# Patient Record
Sex: Male | Born: 1970 | Race: Black or African American | Hispanic: No | Marital: Married | Smoking: Never smoker
Health system: Southern US, Community
[De-identification: ages and names within clinical notes are randomized; demographics above are authoritative.]

## PROBLEM LIST (undated history)

## (undated) DIAGNOSIS — Z8781 Personal history of (healed) traumatic fracture: Secondary | ICD-10-CM

## (undated) HISTORY — PX: ANKLE SURGERY: SHX546

## (undated) HISTORY — DX: Personal history of (healed) traumatic fracture: Z87.81

---

## 2009-12-29 ENCOUNTER — Ambulatory Visit: Payer: Self-pay | Admitting: Radiology

## 2009-12-29 ENCOUNTER — Emergency Department (HOSPITAL_BASED_OUTPATIENT_CLINIC_OR_DEPARTMENT_OTHER): Admission: EM | Admit: 2009-12-29 | Discharge: 2009-12-29 | Payer: Self-pay | Admitting: Emergency Medicine

## 2010-07-24 ENCOUNTER — Emergency Department (HOSPITAL_BASED_OUTPATIENT_CLINIC_OR_DEPARTMENT_OTHER): Admission: EM | Admit: 2010-07-24 | Discharge: 2010-07-24 | Payer: Self-pay | Admitting: Emergency Medicine

## 2013-05-05 ENCOUNTER — Emergency Department (HOSPITAL_BASED_OUTPATIENT_CLINIC_OR_DEPARTMENT_OTHER)
Admission: EM | Admit: 2013-05-05 | Discharge: 2013-05-05 | Disposition: A | Discharge status: Home / Self Care | Payer: No Typology Code available for payment source | Attending: Emergency Medicine | Admitting: Emergency Medicine

## 2013-05-05 ENCOUNTER — Encounter (HOSPITAL_BASED_OUTPATIENT_CLINIC_OR_DEPARTMENT_OTHER): Payer: Self-pay

## 2013-05-05 DIAGNOSIS — Y92410 Unspecified street and highway as the place of occurrence of the external cause: Secondary | ICD-10-CM | POA: Insufficient documentation

## 2013-05-05 DIAGNOSIS — Y9389 Activity, other specified: Secondary | ICD-10-CM | POA: Insufficient documentation

## 2013-05-05 DIAGNOSIS — R404 Transient alteration of awareness: Secondary | ICD-10-CM | POA: Insufficient documentation

## 2013-05-05 DIAGNOSIS — S39012A Strain of muscle, fascia and tendon of lower back, initial encounter: Secondary | ICD-10-CM

## 2013-05-05 DIAGNOSIS — S335XXA Sprain of ligaments of lumbar spine, initial encounter: Secondary | ICD-10-CM | POA: Insufficient documentation

## 2013-05-05 MED ORDER — METHOCARBAMOL 500 MG PO TABS: 500.0000 mg | ORAL_TABLET | Freq: Two times a day (BID) | ORAL | Status: DC

## 2013-05-05 MED ORDER — IBUPROFEN 800 MG PO TABS: 800.0000 mg | ORAL_TABLET | Freq: Three times a day (TID) | ORAL | Status: DC
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
## 2013-05-05 NOTE — ED Provider Notes (Signed)
Medical screening examination/treatment/procedure(s) were performed by non-physician practitioner and as supervising physician I was immediately available for consultation/collaboration.  Layla Maw Steffon Gladu, DO 05/05/13 2315
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
## 2015-07-25 ENCOUNTER — Encounter (HOSPITAL_BASED_OUTPATIENT_CLINIC_OR_DEPARTMENT_OTHER): Payer: Self-pay | Admitting: *Deleted

## 2015-07-25 ENCOUNTER — Emergency Department (HOSPITAL_BASED_OUTPATIENT_CLINIC_OR_DEPARTMENT_OTHER)
Admission: EM | Admit: 2015-07-25 | Discharge: 2015-07-25 | Disposition: A | Discharge status: Home / Self Care | Payer: Self-pay | Attending: Emergency Medicine | Admitting: Emergency Medicine

## 2015-07-25 DIAGNOSIS — J069 Acute upper respiratory infection, unspecified: Secondary | ICD-10-CM

## 2015-07-25 DIAGNOSIS — Z87828 Personal history of other (healed) physical injury and trauma: Secondary | ICD-10-CM | POA: Insufficient documentation

## 2015-07-25 DIAGNOSIS — Z79899 Other long term (current) drug therapy: Secondary | ICD-10-CM | POA: Insufficient documentation

## 2015-07-25 MED ORDER — FLUTICASONE PROPIONATE 50 MCG/ACT NA SUSP: 2.0000 | Freq: Every day | NASAL | Status: DC

## 2015-07-25 MED ORDER — FEXOFENADINE-PSEUDOEPHED ER 60-120 MG PO TB12
1.0000 | ORAL_TABLET | Freq: Two times a day (BID) | ORAL | Status: DC
Start: 2015-07-25 — End: 2017-03-13

## 2015-07-25 NOTE — ED Notes (Signed)
States has had a cough with congestion for the past week. States has prod cough of thick green sputum

## 2015-07-25 NOTE — ED Notes (Signed)
MD at bedside. 

## 2015-07-25 NOTE — ED Provider Notes (Signed)
CSN: [id number]     Arrival date & time 07/25/15  [id number] History   First MD Initiated Contact with Patient 07/25/15 [phone]     Chief Complaint  Patient presents with  . Nasal Congestion     (Consider location/radiation/quality/duration/timing/severity/associated sxs/prior Treatment) Patient is a 44 y.o. male presenting with URI. The history is provided by the patient.  URI Presenting symptoms: congestion and cough   Presenting symptoms: no fever and no sore throat   Severity:  Moderate Onset quality:  Gradual Duration:  1 week Timing:  Constant Progression:  Worsening Chronicity:  New Relieved by:  Nothing Worsened by:  Nothing tried Ineffective treatments:  None tried Associated symptoms: headaches   Risk factors: no chronic respiratory disease, no diabetes mellitus and no recent illness     History reviewed. No pertinent past medical history. Past Surgical History  Procedure Laterality Date  . Ankle surgery     History reviewed. No pertinent family history. Social History  Substance Use Topics  . Smoking status: Never Smoker   . Smokeless tobacco: None  . Alcohol Use: No    Review of Systems  Constitutional: Negative for fever.  HENT: Positive for congestion. Negative for sore throat.   Respiratory: Positive for cough.   Neurological: Positive for headaches.  All other systems reviewed and are negative.     Allergies  Review of patient's allergies indicates no known allergies.  Home Medications   Prior to Admission medications   Medication Sig Start Date End Date Taking? Authorizing Provider  ibuprofen (ADVIL,MOTRIN) 800 MG tablet Take 1 tablet (800 mg total) by mouth 3 (three) times daily. 05/05/13   Elson AreasLeslie K Sofia, PA-C  methocarbamol (ROBAXIN) 500 MG tablet Take 1 tablet (500 mg total) by mouth 2 (two) times daily. 05/05/13   Elson AreasLeslie K Sofia, PA-C   BP 123/84 mmHg  Pulse 60  Temp(Src) 98.1 F (36.7 C) (Oral)  Resp 18  Ht 5\' 9"  (1.753 m)  Wt 190 lb  (86.183 kg)  BMI 28.05 kg/m2  SpO2 98% Physical Exam  Constitutional: He is oriented to person, place, and time. He appears well-developed and well-nourished. No distress.  HENT:  Head: Normocephalic and atraumatic.  Nose: Right sinus exhibits no maxillary sinus tenderness and no frontal sinus tenderness. Left sinus exhibits no maxillary sinus tenderness and no frontal sinus tenderness.  Eyes: Conjunctivae are normal.  Neck: Neck supple. No tracheal deviation present.  Cardiovascular: Normal rate and regular rhythm.   Pulmonary/Chest: Effort normal. No respiratory distress.  Abdominal: Soft. He exhibits no distension.  Neurological: He is alert and oriented to person, place, and time.  Skin: Skin is warm and dry.  Psychiatric: He has a normal mood and affect.    ED Course  Procedures (including critical care time) Labs Review Labs Reviewed - No data to display  Imaging Review No results found. I have personally reviewed and evaluated these images and lab results as part of my medical decision-making.   EKG Interpretation None      MDM   Final diagnoses:  Upper respiratory infection    44 year old male presents with 1 week of typical upper respiratory infection symptoms which include nasal congestion, cough, sputum production. He is afebrile and otherwise well-appearing. He has no difficulty breathing currently. No indication for imaging. Possibility of postnasal drip secondary to allergic rhinitis. We'll treat empirically with decongestant, antihistamine, nasal spray. Patient needs to establish primary care in the area and was provided contact information to do so.  Lyndal Pulley, MD 07/25/15 [phone]

## 2015-07-25 NOTE — Discharge Instructions (Signed)
Upper Respiratory Infection, Adult Most upper respiratory infections (URIs) are a viral infection of the air passages leading to the lungs. A URI affects the nose, throat, and upper air passages. The most common type of URI is nasopharyngitis and is typically referred to as "the common cold." URIs run their course and usually go away on their own. Most of the time, a URI does not require medical attention, but sometimes a bacterial infection in the upper airways can follow a viral infection. This is called a secondary infection. Sinus and middle ear infections are common types of secondary upper respiratory infections. Bacterial pneumonia can also complicate a URI. A URI can worsen asthma and chronic obstructive pulmonary disease (COPD). Sometimes, these complications can require emergency medical care and may be life threatening.  CAUSES Almost all URIs are caused by viruses. A virus is a type of germ and can spread from one person to another.  RISKS FACTORS You may be at risk for a URI if:   You smoke.   You have chronic heart or lung disease.  You have a weakened defense (immune) system.   You are very young or very old.   You have nasal allergies or asthma.  You work in crowded or poorly ventilated areas.  You work in health care facilities or schools. SIGNS AND SYMPTOMS  Symptoms typically develop 2-3 days after you come in contact with a cold virus. Most viral URIs last 7-10 days. However, viral URIs from the influenza virus (flu virus) can last 14-18 days and are typically more severe. Symptoms may include:   Runny or stuffy (congested) nose.   Sneezing.   Cough.   Sore throat.   Headache.   Fatigue.   Fever.   Loss of appetite.   Pain in your forehead, behind your eyes, and over your cheekbones (sinus pain).  Muscle aches.  DIAGNOSIS  Your health care provider may diagnose a URI by:  Physical exam.  Tests to check that your symptoms are not due to  another condition such as:  Strep throat.  Sinusitis.  Pneumonia.  Asthma. TREATMENT  A URI goes away on its own with time. It cannot be cured with medicines, but medicines may be prescribed or recommended to relieve symptoms. Medicines may help:  Reduce your fever.  Reduce your cough.  Relieve nasal congestion. HOME CARE INSTRUCTIONS   Take medicines only as directed by your health care provider.   Gargle warm saltwater or take cough drops to comfort your throat as directed by your health care provider.  Use a warm mist humidifier or inhale steam from a shower to increase air moisture. This may make it easier to breathe.  Drink enough fluid to keep your urine clear or pale yellow.   Eat soups and other clear broths and maintain good nutrition.   Rest as needed.   Return to work when your temperature has returned to normal or as your health care provider advises. You may need to stay home longer to avoid infecting others. You can also use a face mask and careful hand washing to prevent spread of the virus.  Increase the usage of your inhaler if you have asthma.   Do not use any tobacco products, including cigarettes, chewing tobacco, or electronic cigarettes. If you need help quitting, ask your health care provider. PREVENTION  The best way to protect yourself from getting a cold is to practice good hygiene.   Avoid oral or hand contact with people with cold   symptoms.   Wash your hands often if contact occurs.  There is no clear evidence that vitamin C, vitamin E, echinacea, or exercise reduces the chance of developing a cold. However, it is always recommended to get plenty of rest, exercise, and practice good nutrition.  SEEK MEDICAL CARE IF:   You are getting worse rather than better.   Your symptoms are not controlled by medicine.   You have chills.  You have worsening shortness of breath.  You have brown or red mucus.  You have yellow or brown nasal  discharge.  You have pain in your face, especially when you bend forward.  You have a fever.  You have swollen neck glands.  You have pain while swallowing.  You have white areas in the back of your throat. SEEK IMMEDIATE MEDICAL CARE IF:   You have severe or persistent:  Headache.  Ear pain.  Sinus pain.  Chest pain.  You have chronic lung disease and any of the following:  Wheezing.  Prolonged cough.  Coughing up blood.  A change in your usual mucus.  You have a stiff neck.  You have changes in your:  Vision.  Hearing.  Thinking.  Mood. MAKE SURE YOU:   Understand these instructions.  Will watch your condition.  Will get help right away if you are not doing well or get worse.   This information is not intended to replace advice given to you by your health care provider. Make sure you discuss any questions you have with your health care provider.   Document Released: 02/15/2001 Document Revised: 01/06/2015 Document Reviewed: 11/27/2013 Elsevier Interactive Patient Education 2016 [employer]  

## 2017-03-13 ENCOUNTER — Encounter (HOSPITAL_BASED_OUTPATIENT_CLINIC_OR_DEPARTMENT_OTHER): Payer: Self-pay | Admitting: *Deleted

## 2017-03-13 ENCOUNTER — Emergency Department (HOSPITAL_BASED_OUTPATIENT_CLINIC_OR_DEPARTMENT_OTHER)
Admission: EM | Admit: 2017-03-13 | Discharge: 2017-03-14 | Disposition: A | Discharge status: Home / Self Care | Payer: Self-pay | Attending: Emergency Medicine | Admitting: Emergency Medicine

## 2017-03-13 DIAGNOSIS — R1031 Right lower quadrant pain: Secondary | ICD-10-CM | POA: Insufficient documentation

## 2017-03-13 DIAGNOSIS — R109 Unspecified abdominal pain: Secondary | ICD-10-CM

## 2017-03-13 DIAGNOSIS — J069 Acute upper respiratory infection, unspecified: Secondary | ICD-10-CM

## 2017-03-13 DIAGNOSIS — Z79899 Other long term (current) drug therapy: Secondary | ICD-10-CM | POA: Insufficient documentation

## 2017-03-13 LAB — CBC
HCT: 43.4 % (ref 39.0–52.0)
Hemoglobin: 14.5 g/dL (ref 13.0–17.0)
MCH: 30.9 pg (ref 26.0–34.0)
MCHC: 33.4 g/dL (ref 30.0–36.0)
MCV: 92.3 fL (ref 78.0–100.0)
PLATELETS: 252 10*3/uL (ref 150–400)
RBC: 4.7 MIL/uL (ref 4.22–5.81)
RDW: 12.5 % (ref 11.5–15.5)
WBC: 10 10*3/uL (ref 4.0–10.5)

## 2017-03-13 LAB — URINALYSIS, ROUTINE W REFLEX MICROSCOPIC
BILIRUBIN URINE: NEGATIVE
Glucose, UA: NEGATIVE mg/dL
HGB URINE DIPSTICK: NEGATIVE
KETONES UR: NEGATIVE mg/dL
NITRITE: NEGATIVE
PH: 6 (ref 5.0–8.0)
Protein, ur: NEGATIVE mg/dL
SPECIFIC GRAVITY, URINE: 1.026 (ref 1.005–1.030)

## 2017-03-13 LAB — COMPREHENSIVE METABOLIC PANEL
ALK PHOS: 75 U/L (ref 38–126)
ALT: 49 U/L (ref 17–63)
AST: 29 U/L (ref 15–41)
Albumin: 4.1 g/dL (ref 3.5–5.0)
Anion gap: 7 (ref 5–15)
BUN: 10 mg/dL (ref 6–20)
CALCIUM: 8.9 mg/dL (ref 8.9–10.3)
CHLORIDE: 104 mmol/L (ref 101–111)
CO2: 26 mmol/L (ref 22–32)
CREATININE: 1.26 mg/dL — AB (ref 0.61–1.24)
GFR calc Af Amer: >60 mL/min (ref >60)
GFR calc non Af Amer: >60 mL/min (ref >60)
Glucose, Bld: 112 mg/dL — ABNORMAL HIGH (ref 65–99)
Potassium: 3.6 mmol/L (ref 3.5–5.1)
SODIUM: 137 mmol/L (ref 135–145)
Total Bilirubin: 0.5 mg/dL (ref 0.3–1.2)
Total Protein: 7.5 g/dL (ref 6.5–8.1)

## 2017-03-13 LAB — URINALYSIS, MICROSCOPIC (REFLEX): RBC / HPF: NONE SEEN RBC/hpf (ref 0–5)

## 2017-03-13 LAB — LIPASE, BLOOD: LIPASE: 37 U/L (ref 11–51)

## 2017-03-13 MED ORDER — ONDANSETRON HCL 4 MG/2ML IJ SOLN
4.0000 mg | Freq: Once | INTRAMUSCULAR | Status: AC
  Administered 2017-03-13: 4 mg via INTRAVENOUS
  Filled 2017-03-13: qty 2

## 2017-03-13 MED ORDER — SODIUM CHLORIDE 0.9 % IV BOLUS (SEPSIS)
1000.0000 mL | Freq: Once | INTRAVENOUS | Status: AC
  Administered 2017-03-13: 1000 mL via INTRAVENOUS

## 2017-03-13 MED ORDER — MORPHINE SULFATE (PF) 4 MG/ML IV SOLN
4.0000 mg | Freq: Once | INTRAVENOUS | Status: AC
  Administered 2017-03-13: 4 mg via INTRAVENOUS
  Filled 2017-03-13: qty 1

## 2017-03-13 NOTE — ED Provider Notes (Signed)
TIME SEEN: 11:40 PM  By signing my name below, I, Cynda Acres, attest that this documentation has been prepared under the direction and in the presence of Renetta Suman, Layla Maw, DO. Electronically Signed: Cynda Acres, Scribe. 03/13/17. 11:50 PM.  CHIEF COMPLAINT: Right flank pain   HPI:  Christian Tate is a 46 y.o. male with no pertinent past medical history,  who presents to the Emergency Department complaining of sudden-onset, constant right flank pain that began two hours prior to arrival. Patient reports developing gradually worsening right flank pain earlier tonight. Patient reports going to gym recently. P he doesn't recall doing anything increasingly severe as her injuring his back. He does report the pain is worse with movement and palpation. No history of kidney stones, kidney infections. No bowel or bladder incontinence. No numbness, tingling or focal weakness. He is able to ambulate. No midline back pain. No dysuria or hematuria. Denies nausea, vomiting or diarrhea.   Patient is also had a cough with green sputum production for the past several days and subjective fever. He denies any chest pain or shortness of breath. Has also had nasal congestion.     ROS: See HPI Constitutional: Subjective fever  Eyes: no drainage  ENT:  runny nose   Cardiovascular:  no chest pain  Resp: no SOB  GI: no vomiting GU: no dysuria Integumentary: no rash  Allergy: no hives  Musculoskeletal: no leg swelling  Neurological: no slurred speech ROS otherwise negative  PAST MEDICAL HISTORY/PAST SURGICAL HISTORY:  History reviewed. No pertinent past medical history.  MEDICATIONS:  Prior to Admission medications   Medication Sig Start Date End Date Taking? Authorizing Provider  fluticasone (FLONASE) 50 MCG/ACT nasal spray Place 2 sprays into both nostrils daily. 07/25/15   Lyndal Pulley, MD  ibuprofen (ADVIL,MOTRIN) 800 MG tablet Take 1 tablet (800 mg total) by mouth 3 (three) times daily. 05/05/13    Elson Areas, PA-C    ALLERGIES:  No Known Allergies  SOCIAL HISTORY:  Social History  Substance Use Topics  . Smoking status: Never Smoker  . Smokeless tobacco: Not on file  . Alcohol use No    FAMILY HISTORY: History reviewed. No pertinent family history.  EXAM: BP 137/89 (BP Location: Left Arm)   Pulse (!) 52   Temp 98.9 F (37.2 C) (Oral)   Resp 16   Ht 5\' 9"  (1.753 m)   Wt 190 lb (86.2 kg)   SpO2 100%   BMI 28.06 kg/m  CONSTITUTIONAL: Alert and oriented and responds appropriately to questions. Well-appearing; well-nourished HEAD: Normocephalic EYES: Conjunctivae clear, pupils appear equal, EOMI ENT: normal nose; moist mucous membranes; No pharyngeal erythema or petechiae, no tonsillar hypertrophy or exudate, no uvular deviation, no unilateral swelling, no trismus or drooling, no muffled voice, normal phonation, no stridor, no dental caries present, no drainable dental abscess noted, no Ludwig's angina, tongue sits flat in the bottom of the mouth, no angioedema, no facial erythema or warmth, no facial swelling; no pain with movement of the neck. NECK: Supple, no meningismus, no nuchal rigidity, no LAD  CARD: RRR; S1 and S2 appreciated; no murmurs, no clicks, no rubs, no gallops RESP: Normal chest excursion without splinting or tachypnea; breath sounds clear and equal bilaterally; no wheezes, no rhonchi, no rales, no hypoxia or respiratory distress, speaking full sentences ABD/GI: Normal bowel sounds; non-distended; soft, non-tender, no rebound, no guarding, no peritoneal signs, no hepatosplenomegaly BACK:  The back appears normal and is mildly tender over the right flank, no midline  spinal tenderness or step-off or deformity, there is no CVA tenderness EXT: Normal ROM in all joints; non-tender to palpation; no edema; normal capillary refill; no cyanosis, no calf tenderness or swelling    SKIN: Normal color for age and race; warm; no rash NEURO: Moves all extremities  equally, no saddle anesthesia, sensation to light touch intact diffusely, normal speech, normal gait PSYCH: The patient's mood and manner are appropriate. Grooming and personal hygiene are appropriate.  MEDICAL DECISION MAKING: Patient here to [facility] complaints. Has had subjective fevers, cough of the past several days. He does feel warm to touch air but is afebrile. We'll obtain chest x-ray for further evaluation. Also complaining of right flank pain. Labs obtained in triage are unremarkable other than creatinine of 1.26. Urine shows small leukocytes but rare bacteria. He is not having urinary symptoms. No blood. LFTs, lipase, white blood cell count normal. We'll obtain CT of his abdomen for further evaluation. Differential includes kidney stone, pyelonephritis, colitis, appendicitis.  ED PROGRESS: Patient's CT scan unremarkable. No nephrolithiasis, hydronephrosis, perinephric stranding. Appendix is normal. Bowel appears normal. Suspect this is musculoskeletal in nature. Pain improved with morphine. His chest x-ray is also clear. I do not feel he needs antibiotics at this time. We'll discharge with course of Vicodin for pain control. Recommended close outpatient follow-up if symptoms continue are not improving. He does not have any focal neurologic deficits to suggest cauda equina, epidural abscess or hematoma, discitis, transverse myelitis.  No midline tenderness on exam. Neurologically intact.  At this time, I do not feel there is any life-threatening condition present. I have reviewed and discussed all results (EKG, imaging, lab, urine as appropriate) and exam findings with patient/family. I have reviewed nursing notes and appropriate previous records.  I feel the patient is safe to be discharged home without further emergent workup and can continue workup as an outpatient as needed. Discussed usual and customary return precautions. Patient/family verbalize understanding and are comfortable with this plan.   Outpatient follow-up has been provided if needed. All questions have been answered.   I personally performed the services described in this documentation, which was scribed in my presence. The recorded information has been reviewed and is accurate.     Clyde Zarrella, Layla MawKristen N, DO 03/14/17 [phone]

## 2017-03-13 NOTE — ED Triage Notes (Signed)
Pt c/o left flank pain x 2 hrs 

## 2017-03-14 ENCOUNTER — Emergency Department (HOSPITAL_BASED_OUTPATIENT_CLINIC_OR_DEPARTMENT_OTHER): Payer: Self-pay

## 2017-03-14 MED ORDER — GUAIFENESIN 100 MG/5ML PO LIQD: 100.0000 mg | ORAL | 0 refills | Status: DC | PRN

## 2017-03-14 MED ORDER — HYDROCODONE-ACETAMINOPHEN 5-325 MG PO TABS: 1.0000 | ORAL_TABLET | Freq: Four times a day (QID) | ORAL | 0 refills | Status: DC | PRN

## 2017-03-14 NOTE — ED Notes (Signed)
Patient transported to CT 

## 2017-03-14 NOTE — ED Notes (Signed)
ED Provider at bedside discussing test results and dispo plan of care. 

## 2017-07-20 ENCOUNTER — Ambulatory Visit: Payer: Self-pay | Admitting: Family Medicine

## 2017-08-09 ENCOUNTER — Ambulatory Visit: Payer: Self-pay | Admitting: Nurse Practitioner

## 2018-12-25 IMAGING — CT CT RENAL STONE PROTOCOL
2 of 4 series · 16 of 46 positions shown, 18 images · non-contrast
Comparison: None.

CLINICAL DATA: Initial evaluation for acute right flank pain.

EXAM:
CT ABDOMEN AND PELVIS WITHOUT CONTRAST
TECHNIQUE: Multidetector CT imaging of the abdomen and pelvis was performed
following the standard protocol without IV contrast.

[Series 3: axial st · axial · 0.80mm/px · z∈[-190,+260]mm · 13 of 100 slices shown, 15 images]
[im 5/100  soft-tissue]
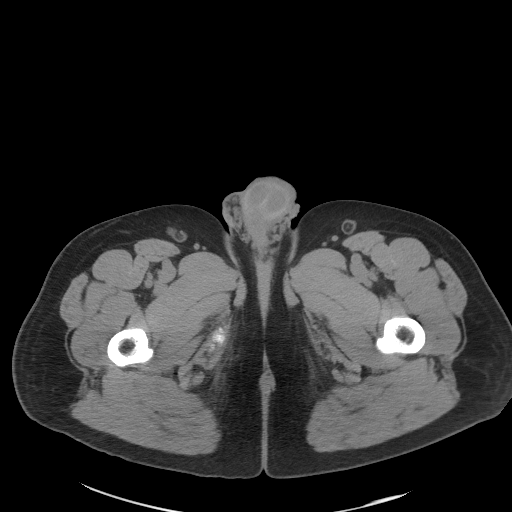
[im 5/100  bone]
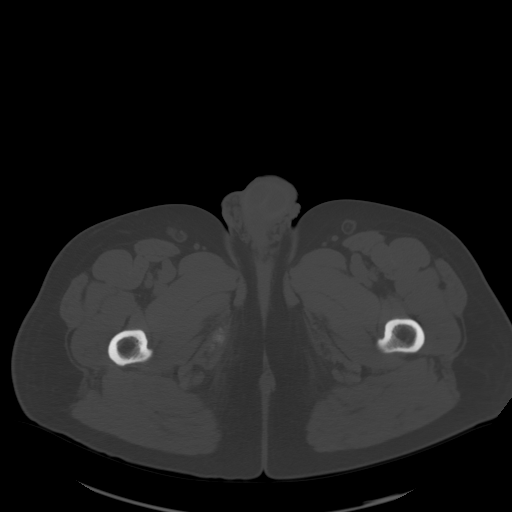
[im 13/100  soft-tissue]
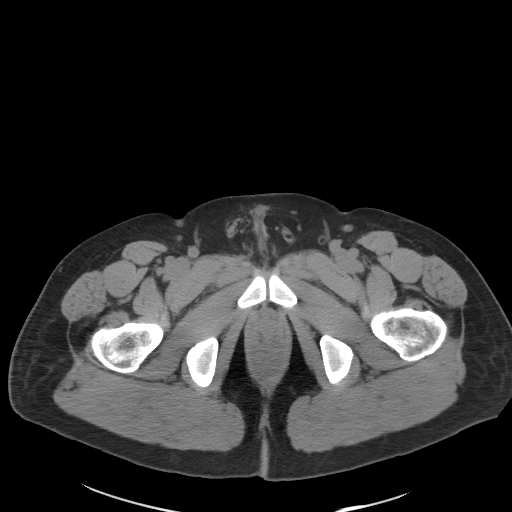
[im 22/100  soft-tissue]
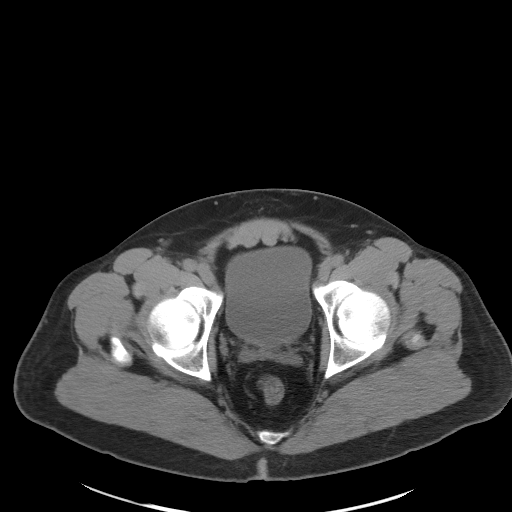
[im 26/100  soft-tissue]
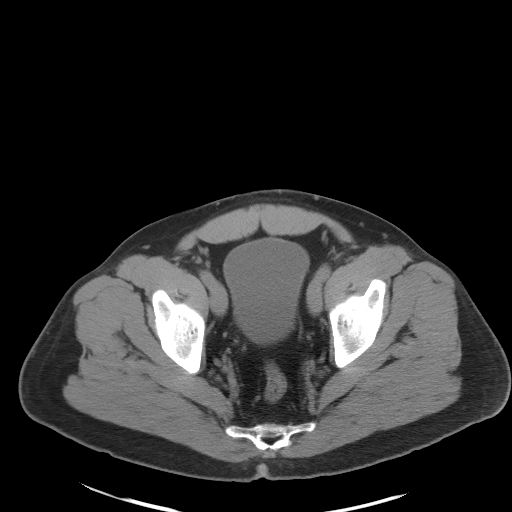
[im 35/100  soft-tissue]
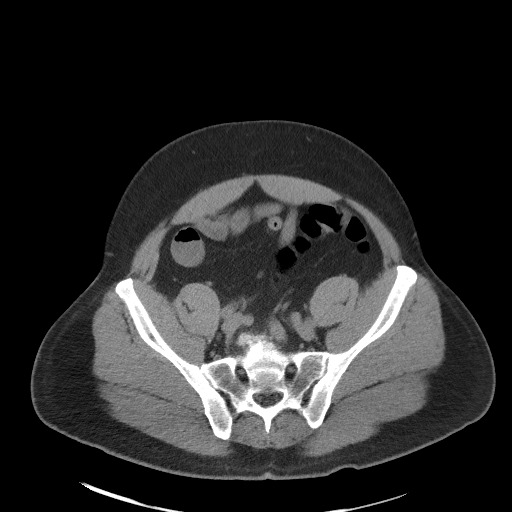
[im 44/100  soft-tissue]
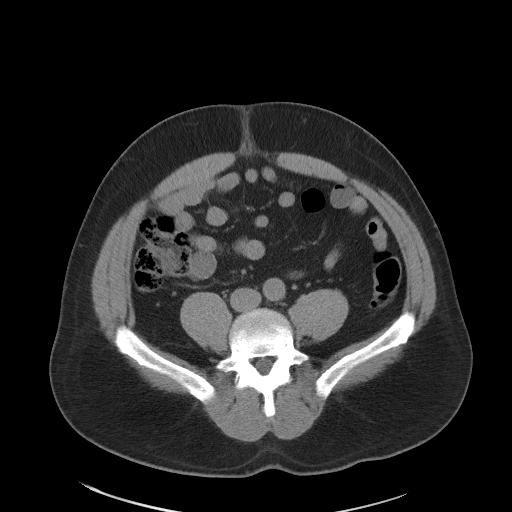
[im 52/100  soft-tissue]
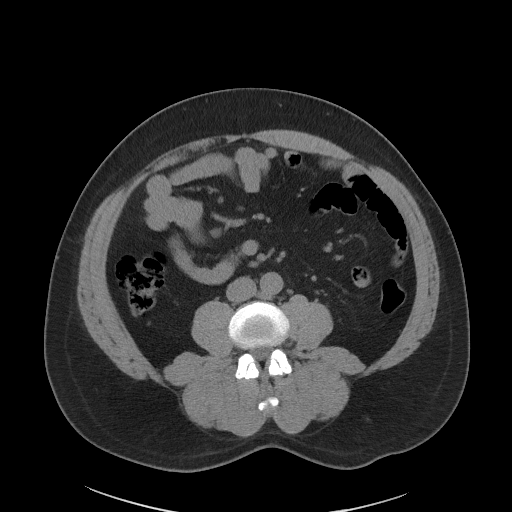
[im 56/100  soft-tissue]
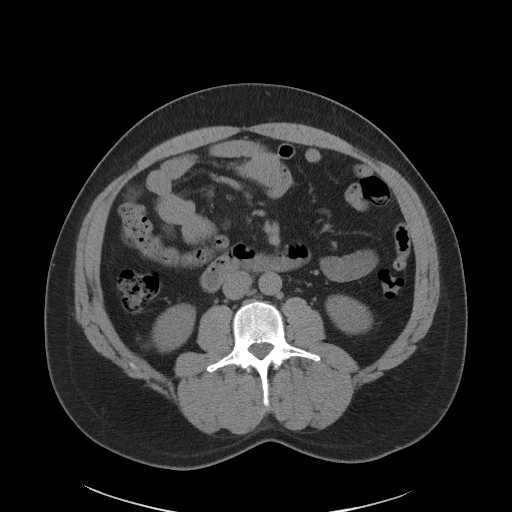
[im 65/100  soft-tissue]
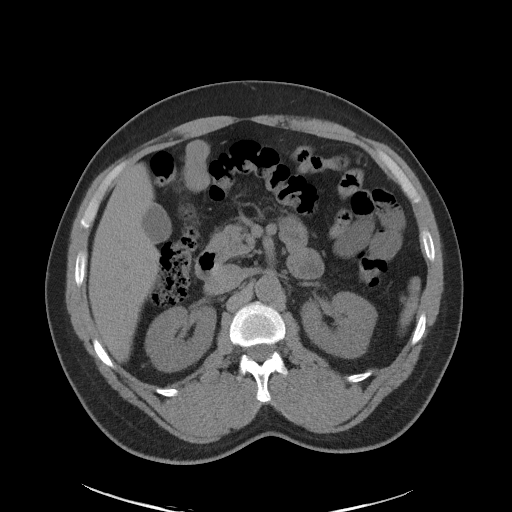
[im 65/100  bone]
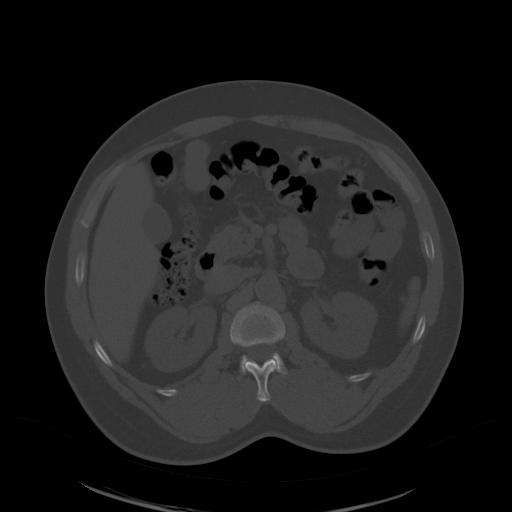
[im 74/100  soft-tissue]
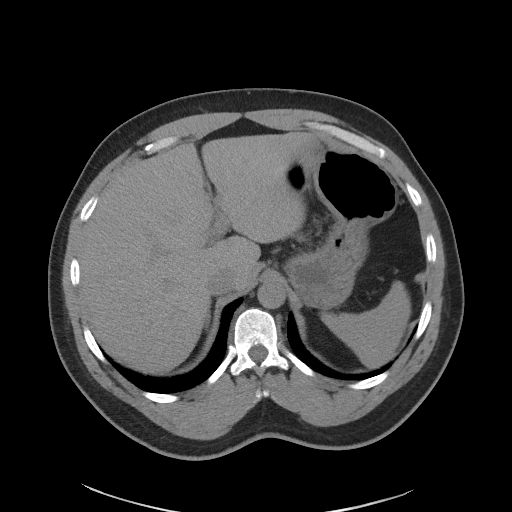
[im 78/100  soft-tissue]
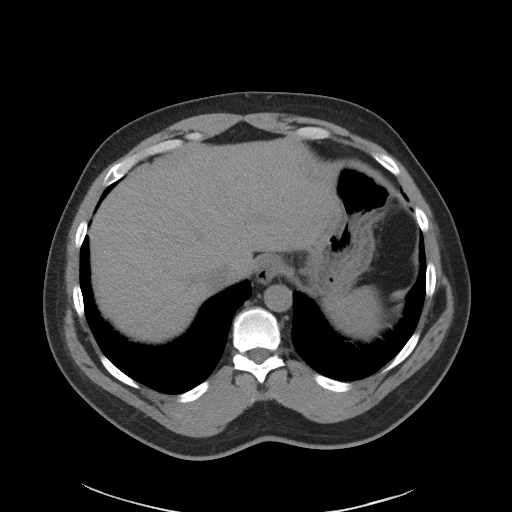
[im 87/100  soft-tissue]
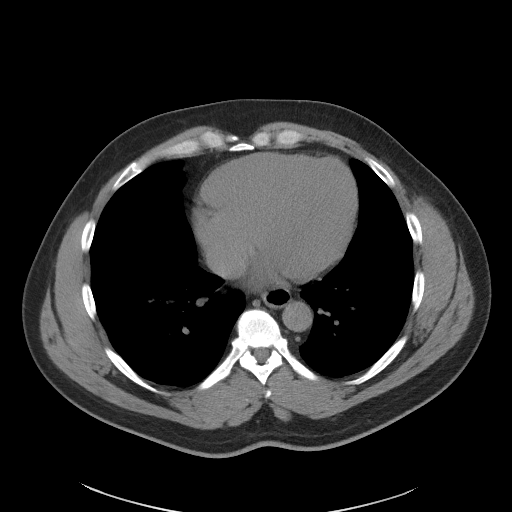
[im 95/100  soft-tissue]
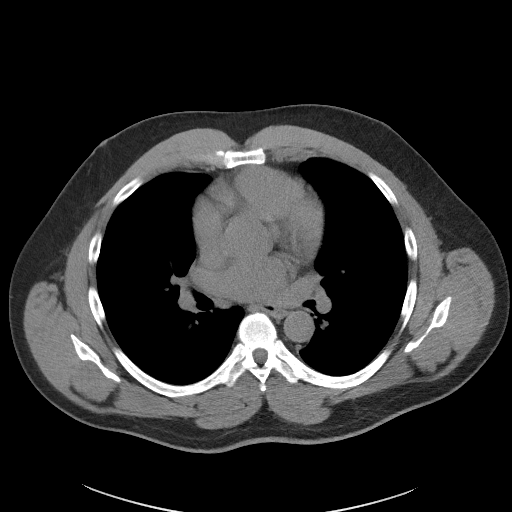

[Series 5: coronal st · coronal · 0.81mm/px · 3 of 94 slices shown]
[im 32/94  soft-tissue]
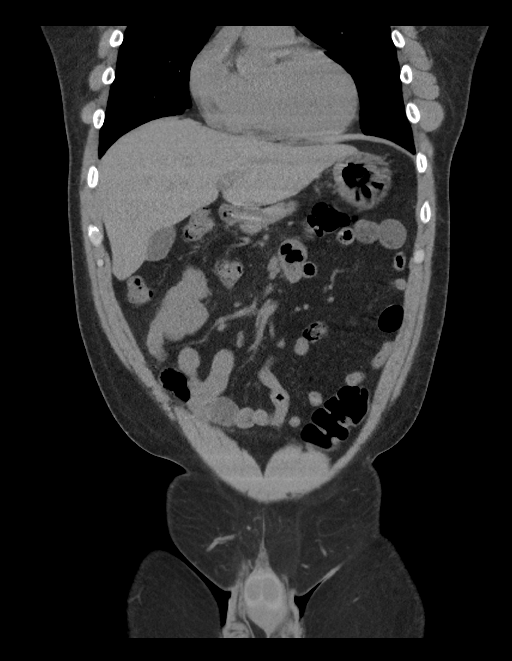
[im 42/94  soft-tissue]
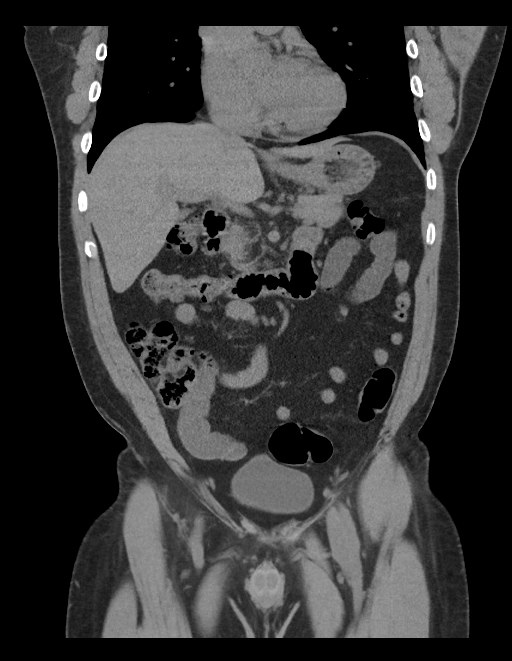
[im 52/94  soft-tissue]
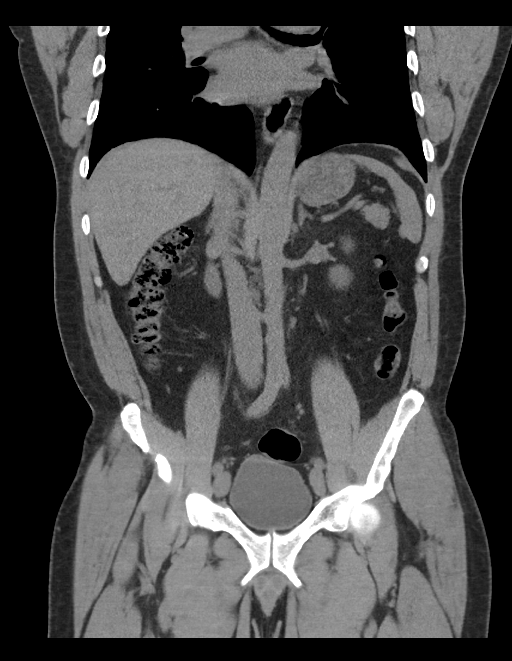

[16 of 46 positions shown; findings below may reference images not displayed]

FINDINGS: Lower chest: Mild hazy subsegmental atelectatic changes present
within the visualized lung bases. Visualized lung bases are
otherwise clear.

Hepatobiliary: Limited noncontrast evaluation liver is unremarkable.
Gallbladder within normal limits. No biliary dilatation.

Pancreas: Pancreas within normal limits.

Spleen: Spleen within normal limits.

Adrenals/Urinary Tract: Adrenal glands are normal. Kidneys equal in
size. 2.8 cm cyst present at the lower pole the right kidney. No
nephrolithiasis or hydronephrosis. No radiopaque calculi seen along
the course of either renal collecting system. No hydroureter.
Bladder partially distended and within normal limits. No layering
stones within the bladder lumen.

Stomach/Bowel: Stomach within normal limits. No evidence for bowel
obstruction. Appendix is normal. No acute inflammatory changes seen
about the bowels.

Vascular/Lymphatic: Intra-abdominal aorta of normal caliber. No
adenopathy.

Reproductive: Prostate within normal limits.

Other: No free air or fluid.

Musculoskeletal: No acute osseus abnormality. No worrisome lytic or
blastic osseous lesions. Degenerative spondylolysis noted at L5-S1.
IMPRESSION: 1. No CT evidence for nephrolithiasis for obstructive uropathy.
2. No other acute intra-abdominal or pelvic process identified.
3. 2.8 cm simple right renal cyst.

## 2018-12-25 IMAGING — CR DG CHEST 2V
2 series · 2 of 2 positions shown · non-contrast
Comparison: None.

CLINICAL DATA: Sudden onset of flank pain

EXAM:
CHEST  2 VIEW

[w chest pa]
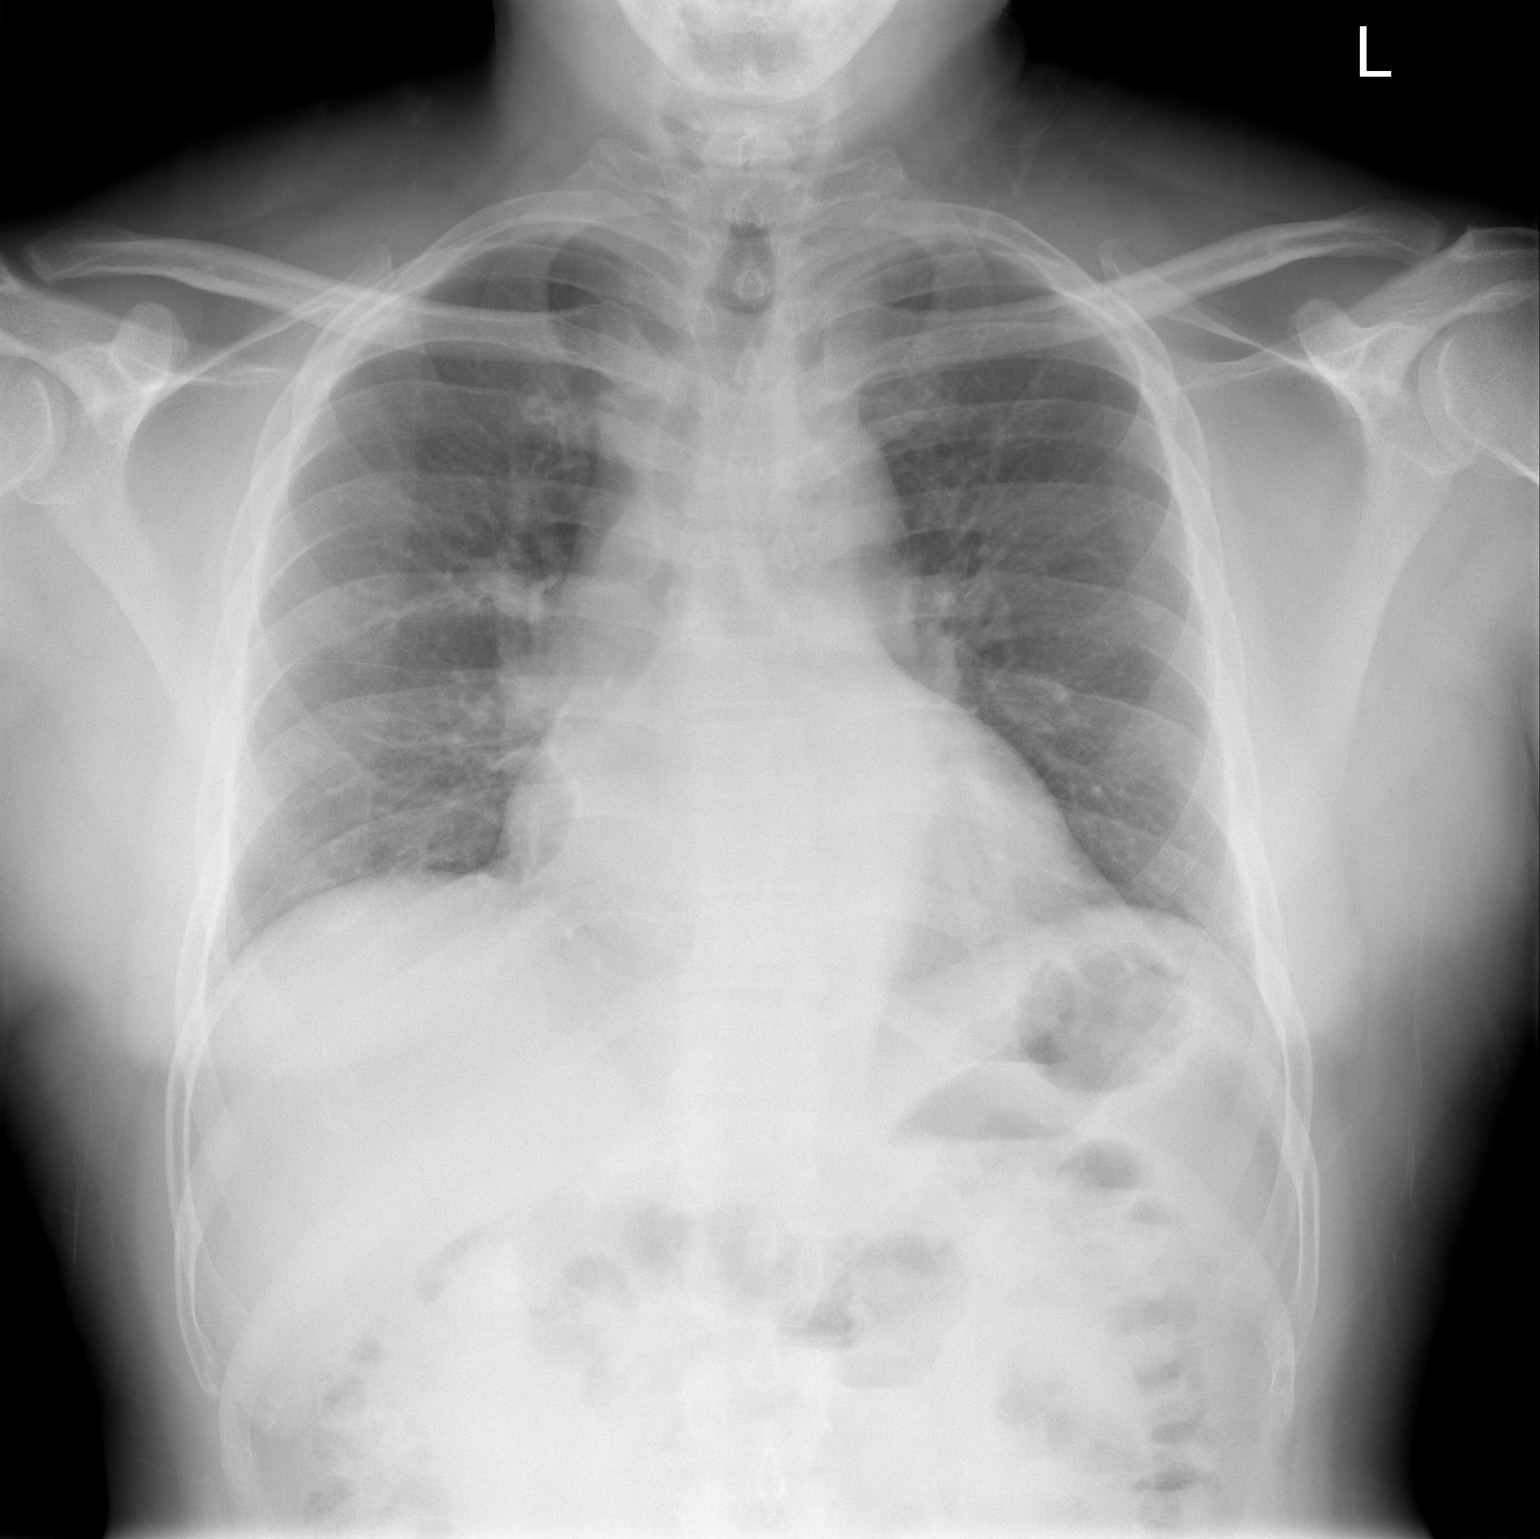

[w chest lat]
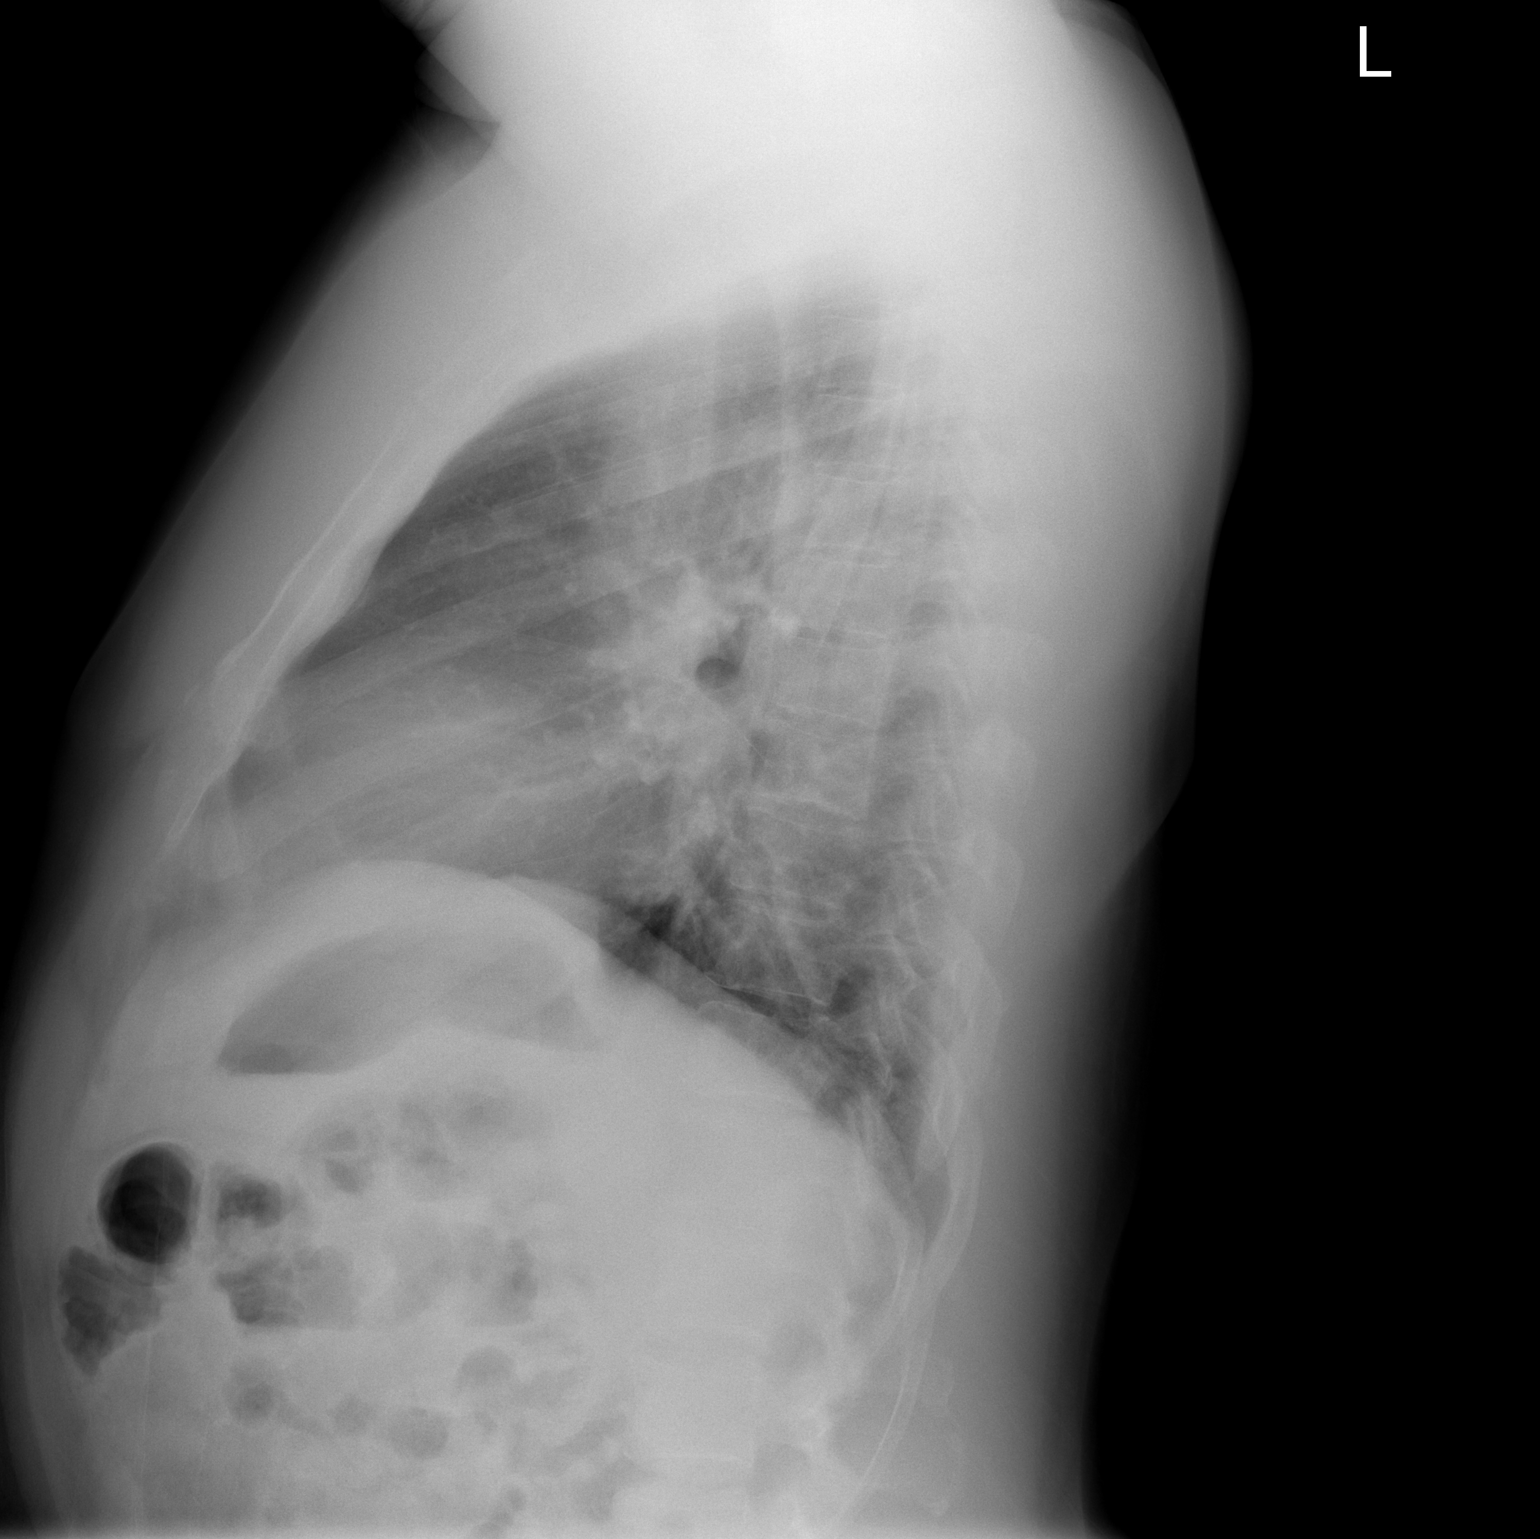

[2 of 2 positions shown; findings below may reference images not displayed]

FINDINGS: No focal pulmonary infiltrate, consolidation or effusion. Heart size
upper limits of normal. No pneumothorax.
IMPRESSION: No active cardiopulmonary disease.

## 2019-07-18 ENCOUNTER — Encounter: Payer: Self-pay | Admitting: Family Medicine

## 2019-07-18 ENCOUNTER — Other Ambulatory Visit: Payer: Self-pay

## 2019-07-18 ENCOUNTER — Ambulatory Visit: Payer: Self-pay | Attending: Family Medicine | Admitting: Family Medicine

## 2019-07-18 DIAGNOSIS — M79671 Pain in right foot: Secondary | ICD-10-CM

## 2019-07-18 NOTE — Progress Notes (Signed)
Virtual Visit via Telephone Note  I connected with Erma Heritage on 12/25/19 at  3:30 PM EST by telephone and verified that I am speaking with the correct person using two identifiers.   I discussed the limitations, risks, security and privacy concerns of performing an evaluation and management service by telephone and the availability of in person appointments. I also discussed with the patient that there may be a patient responsible charge related to this service. The patient expressed understanding and agreed to proceed.  Patient Location: Home Provider Location: [facility] Others participating in call: none   History of Present Illness:         48 yo male male new to the practice. He reports no significant medical issues other than recent onset of pain in his right foot at the lower part of the ankle/heel which has been occurring for around 2 weeks.  He does have a remote history of ankle fracture about 10 years ago.  He has noticed that he gets an intermittent numb or tingling sensation that can occur when he is walking or standing.  He does not have any actual pain at today's visit.  He has not tried taking any over-the-counter medications or use of topical creams when the abnormal sensation occurs.  He denies any history of diabetes or elevated blood sugar.  He has had no recent new injury to the ankle/foot.  He has not noticed any significant swelling.  Past Medical History:  Diagnosis Date  . History of fracture of right ankle     Past Surgical History:  Procedure Laterality Date  . ANKLE SURGERY      Family History  Problem Relation Age of Onset  . Diabetes Father   . Prostate cancer Maternal Grandfather   . Hypertension Neg Hx     Social History   Tobacco Use  . Smoking status: Never Smoker  . Smokeless tobacco: Never Used  Substance Use Topics  . Alcohol use: No  . Drug use: Yes    Types: Marijuana     No Known Allergies     Observations/Objective: No vital  signs or physical exam conducted as visit was done via telephone  Assessment and Plan: 1. Pain of right heel Discussed with the patient that after fracture, patients can sometimes develop bone spurring or arthritis which can sometimes periodically cause pain especially in cold weather.  Patient also is advised to make sure that he has comfortable, supportive shoes as numbness and tingling sensations can also occur with restrictive shoes or if patient has some swelling in the foot and ankle area after prolonged walking or swelling.  He can also try over-the-counter topical arthritis medication such as Voltaren gel if he is having actual ankle pain.  If he continues to have the sensation of numbness, he is asked to schedule an actual office visit for further evaluation as well as blood work physical exam to look for causes of numbness/tingling such as vitamin B12 deficiency, diabetes, tarsal tunnel syndrome or thyroid disorder.  He may also need repeat x-ray as well as referral to podiatry/orthopedics if he is not getting any improvement in symptoms.  He is also encouraged to apply for the [facility] financial discount program as he is currently uninsured.  He is also encouraged to schedule visit for annual well exam.  Follow Up Instructions:Return in about 5 weeks (around 08/22/2019) for well physical exam.    I discussed the assessment and treatment plan with the patient. The patient  was provided an opportunity to ask questions and all were answered. The patient agreed with the plan and demonstrated an understanding of the instructions.   The patient was advised to call back or seek an in-person evaluation if the symptoms worsen or if the condition fails to improve as anticipated.  I provided 7 minutes of non-face-to-face time during this encounter.   Antony Blackbird, MD

## 2019-07-18 NOTE — Progress Notes (Signed)
Patient verified DOB Patient has eaten today. Patient has taken medications. Patient denies pain at this time. Patient complains of breaking his right ankle about 10 years ago. Patient states for the past two week he has been feeling a numbing sensation intermittently. Sensation presents when patient is standing and walking.

## 2019-09-25 ENCOUNTER — Emergency Department (HOSPITAL_BASED_OUTPATIENT_CLINIC_OR_DEPARTMENT_OTHER)
Admission: EM | Admit: 2019-09-25 | Discharge: 2019-09-25 | Disposition: A | Discharge status: Home / Self Care | Payer: Self-pay | Attending: Emergency Medicine | Admitting: Emergency Medicine

## 2019-09-25 ENCOUNTER — Other Ambulatory Visit: Payer: Self-pay

## 2019-09-25 ENCOUNTER — Encounter (HOSPITAL_BASED_OUTPATIENT_CLINIC_OR_DEPARTMENT_OTHER): Payer: Self-pay | Admitting: *Deleted

## 2019-09-25 DIAGNOSIS — Z113 Encounter for screening for infections with a predominantly sexual mode of transmission: Secondary | ICD-10-CM | POA: Insufficient documentation

## 2019-09-25 DIAGNOSIS — Z79899 Other long term (current) drug therapy: Secondary | ICD-10-CM | POA: Insufficient documentation

## 2019-09-25 DIAGNOSIS — F1210 Cannabis abuse, uncomplicated: Secondary | ICD-10-CM | POA: Insufficient documentation

## 2019-09-25 DIAGNOSIS — R202 Paresthesia of skin: Secondary | ICD-10-CM | POA: Insufficient documentation

## 2019-09-25 DIAGNOSIS — R1033 Periumbilical pain: Secondary | ICD-10-CM | POA: Insufficient documentation

## 2019-09-25 DIAGNOSIS — R112 Nausea with vomiting, unspecified: Secondary | ICD-10-CM | POA: Insufficient documentation

## 2019-09-25 DIAGNOSIS — K5900 Constipation, unspecified: Secondary | ICD-10-CM | POA: Insufficient documentation

## 2019-09-25 LAB — CBC WITH DIFFERENTIAL/PLATELET
Abs Immature Granulocytes: 0.01 10*3/uL (ref 0.00–0.07)
Basophils Absolute: 0 10*3/uL (ref 0.0–0.1)
Basophils Relative: 1 %
Eosinophils Absolute: 0.1 10*3/uL (ref 0.0–0.5)
Eosinophils Relative: 1 %
HCT: 47 % (ref 39.0–52.0)
Hemoglobin: 15.2 g/dL (ref 13.0–17.0)
Immature Granulocytes: 0 %
Lymphocytes Relative: 34 %
Lymphs Abs: 2.5 10*3/uL (ref 0.7–4.0)
MCH: 30.3 pg (ref 26.0–34.0)
MCHC: 32.3 g/dL (ref 30.0–36.0)
MCV: 93.8 fL (ref 80.0–100.0)
Monocytes Absolute: 0.8 10*3/uL (ref 0.1–1.0)
Monocytes Relative: 11 %
Neutro Abs: 3.9 10*3/uL (ref 1.7–7.7)
Neutrophils Relative %: 53 %
Platelets: 291 10*3/uL (ref 150–400)
RBC: 5.01 MIL/uL (ref 4.22–5.81)
RDW: 11.8 % (ref 11.5–15.5)
WBC: 7.4 10*3/uL (ref 4.0–10.5)
nRBC: 0 % (ref 0.0–0.2)

## 2019-09-25 LAB — URINALYSIS, ROUTINE W REFLEX MICROSCOPIC
Bilirubin Urine: NEGATIVE
Glucose, UA: NEGATIVE mg/dL
Hgb urine dipstick: NEGATIVE
Ketones, ur: NEGATIVE mg/dL
Leukocytes,Ua: NEGATIVE
Nitrite: NEGATIVE
Protein, ur: NEGATIVE mg/dL
Specific Gravity, Urine: 1.025 (ref 1.005–1.030)
pH: 6 (ref 5.0–8.0)

## 2019-09-25 LAB — COMPREHENSIVE METABOLIC PANEL
ALT: 25 U/L (ref 0–44)
AST: 19 U/L (ref 15–41)
Albumin: 4.2 g/dL (ref 3.5–5.0)
Alkaline Phosphatase: 76 U/L (ref 38–126)
Anion gap: 7 (ref 5–15)
BUN: 19 mg/dL (ref 6–20)
CO2: 24 mmol/L (ref 22–32)
Calcium: 9.2 mg/dL (ref 8.9–10.3)
Chloride: 105 mmol/L (ref 98–111)
Creatinine, Ser: 1.28 mg/dL — ABNORMAL HIGH (ref 0.61–1.24)
GFR calc Af Amer: >60 mL/min (ref >60)
GFR calc non Af Amer: >60 mL/min (ref >60)
Glucose, Bld: 92 mg/dL (ref 70–99)
Potassium: 3.9 mmol/L (ref 3.5–5.1)
Sodium: 136 mmol/L (ref 135–145)
Total Bilirubin: 0.7 mg/dL (ref 0.3–1.2)
Total Protein: 7.2 g/dL (ref 6.5–8.1)

## 2019-09-25 LAB — LIPASE, BLOOD: Lipase: 40 U/L (ref 11–51)

## 2019-09-25 MED ORDER — OMEPRAZOLE 20 MG PO CPDR: 20.0000 mg | DELAYED_RELEASE_CAPSULE | Freq: Every day | ORAL | 0 refills | Status: AC

## 2019-09-25 NOTE — ED Provider Notes (Signed)
[facility] EMERGENCY DEPARTMENT Provider Note   CSN: [id number] Arrival date & time: 09/25/19  1759     History Chief Complaint  Patient presents with  . Abdominal Pain    Christian Tate is a 49 y.o. male who presents with abdominal pain.  Patient states that about a month and a half ago he started to have some epigastric and periumbilical abdominal discomfort which he describes as a "tingling".  He was able to eat that morning and go to work however when he went to work he had an episode of nausea and vomiting and threw up bright red blood.  Over the past month and a half he has had recurrent abdominal pain in the same area.  Salty foods makes it worse.  Pepcid makes it better.  He has not had any recurrent nausea vomiting or hematemesis.  He states he gets constipated at times. Does not currently have a primary care provider.  He denies prior abdominal surgeries. Has chronic back pain. No fever or chills. He does not take a lot of NSAIDs or drink large amounts of ETOH.  Patient is also have requesting STD check today.  He denies any symptoms but states he would just like a screening.  HPI     Past Medical History:  Diagnosis Date  . History of fracture of right ankle     There are no problems to display for this patient.   Past Surgical History:  Procedure Laterality Date  . ANKLE SURGERY         Family History  Problem Relation Age of Onset  . Diabetes Father   . Prostate cancer Maternal Grandfather   . Hypertension Neg Hx     Social History   Tobacco Use  . Smoking status: Never Smoker  . Smokeless tobacco: Never Used  Substance Use Topics  . Alcohol use: No  . Drug use: Yes    Types: Marijuana    Home Medications Prior to Admission medications   Medication Sig Start Date End Date Taking? Authorizing Provider  ergocalciferol (VITAMIN D2) 1.25 MG (50000 UT) capsule Take 50,000 Units by mouth once a week.    [provider]  Multiple Vitamin  (MULTIVITAMIN WITH MINERALS) TABS tablet Take 1 tablet by mouth daily.    [provider]    Allergies    Patient has no known allergies.  Review of Systems   Review of Systems  Constitutional: Negative for chills and fever.  Respiratory: Negative for shortness of breath.   Cardiovascular: Negative for chest pain.  Gastrointestinal: Positive for abdominal pain and constipation. Negative for blood in stool, diarrhea, nausea and vomiting.  Genitourinary: Negative for discharge, dysuria, frequency and testicular pain.  All other systems reviewed and are negative.   Physical Exam Updated Vital Signs BP 138/86   Pulse 66   Temp 98 F (36.7 C)   Resp 18   Ht 5\' 9"  (1.753 m)   Wt 90.7 kg   SpO2 100%   BMI 29.53 kg/m   Physical Exam Vitals and nursing note reviewed.  Constitutional:      General: He is not in acute distress.    Appearance: He is well-developed. He is not ill-appearing.  HENT:     Head: Normocephalic and atraumatic.  Eyes:     General: No scleral icterus.       Right eye: No discharge.        Left eye: No discharge.     Conjunctiva/sclera: Conjunctivae  normal.     Pupils: Pupils are equal, round, and reactive to light.  Cardiovascular:     Rate and Rhythm: Normal rate and regular rhythm.  Pulmonary:     Effort: Pulmonary effort is normal. No respiratory distress.     Breath sounds: Normal breath sounds.  Abdominal:     General: Abdomen is protuberant. Bowel sounds are normal. There is no distension.     Palpations: Abdomen is soft.     Tenderness: There is no abdominal tenderness.  Musculoskeletal:     Cervical back: Normal range of motion.  Skin:    General: Skin is warm and dry.  Neurological:     Mental Status: He is alert and oriented to person, place, and time.  Psychiatric:        Behavior: Behavior normal.     ED Results / Procedures / Treatments   Labs (all labs ordered are listed, but only abnormal results are displayed) Labs  Reviewed  COMPREHENSIVE METABOLIC PANEL - Abnormal; Notable for the following components:      Result Value   Creatinine, Ser 1.28 (*)    All other components within normal limits  URINALYSIS, ROUTINE W REFLEX MICROSCOPIC  CBC WITH DIFFERENTIAL/PLATELET  LIPASE, BLOOD  RPR  HIV ANTIBODY (ROUTINE TESTING W REFLEX)  GC/CHLAMYDIA PROBE AMP ([place]) NOT AT [facility]    EKG None  Radiology No results found.  Procedures Procedures (including critical care time)  Medications Ordered in ED Medications - No data to display  ED Course  I have reviewed the triage vital signs and the nursing notes.  Pertinent labs & imaging results that were available during my care of the patient were reviewed by me and considered in my medical decision making (see chart for details).  49 year old male presents with intermittent periumbilical abdominal pain for a month and a half.  Symptoms are worsened by eating certain foods.  Better with Pepcid.  Suspect GERD or gastritis.  His vitals are normal.  His abdomen is soft and nontender.  Creatinine is minimally elevated with normal GFR.  He is also requesting STD screen which was added on.  We will give prescription for omeprazole and encouraged him to follow-up with a primary care provider.  He verbalized understanding   MDM Rules/Calculators/A&P                       Final Clinical Impression(s) / ED Diagnoses Final diagnoses:  Periumbilical abdominal pain  Screen for STD (sexually transmitted disease)    Rx / DC Orders ED Discharge Orders    None       Recardo Evangelist, PA-C 09/25/19 2154    Ezequiel Essex, MD 09/25/19 2324

## 2019-09-25 NOTE — Discharge Instructions (Signed)
Avoid foods that aggravate your symptoms Start Omeprazole once daily for stomach pain Please follow up with a primary doctor

## 2019-09-25 NOTE — ED Triage Notes (Signed)
C/o abd pain x 1 month

## 2019-09-27 ENCOUNTER — Encounter: Payer: Self-pay | Admitting: Family Medicine

## 2019-09-27 ENCOUNTER — Telehealth (HOSPITAL_COMMUNITY): Payer: Self-pay

## 2019-09-27 LAB — GC/CHLAMYDIA PROBE AMP (~~LOC~~) NOT AT ARMC
Chlamydia: NEGATIVE
Neisseria Gonorrhea: NEGATIVE

## 2019-10-31 ENCOUNTER — Ambulatory Visit: Payer: Self-pay | Attending: Internal Medicine

## 2019-10-31 DIAGNOSIS — Z20822 Contact with and (suspected) exposure to covid-19: Secondary | ICD-10-CM | POA: Insufficient documentation

## 2019-11-01 LAB — NOVEL CORONAVIRUS, NAA: SARS-CoV-2, NAA: NOT DETECTED

## 2023-02-06 ENCOUNTER — Ambulatory Visit: Payer: Medicaid Other | Attending: Internal Medicine | Admitting: Physical Therapy

## 2023-02-06 DIAGNOSIS — M5459 Other low back pain: Secondary | ICD-10-CM | POA: Insufficient documentation

## 2023-02-06 DIAGNOSIS — M6281 Muscle weakness (generalized): Secondary | ICD-10-CM | POA: Insufficient documentation

## 2023-02-06 DIAGNOSIS — M5417 Radiculopathy, lumbosacral region: Secondary | ICD-10-CM | POA: Insufficient documentation

## 2023-02-09 ENCOUNTER — Ambulatory Visit: Payer: Medicaid Other | Admitting: Physical Therapy

## 2023-02-13 ENCOUNTER — Encounter: Payer: Medicaid Other | Admitting: Physical Therapy

## 2023-02-15 NOTE — Therapy (Signed)
OUTPATIENT PHYSICAL THERAPY THORACOLUMBAR EVALUATION   Patient Name: Christian Tate MRN: [id number] DOB:01-Aug-1971, 52 y.o., male Today's Date: 02/20/2023  END OF SESSION:  PT End of Session - 02/20/23 1610     Visit Number 1    Date for PT Re-Evaluation 04/07/23    Authorization Type [employer]    PT Start Time 1610    PT Stop Time 1710    PT Time Calculation (min) 60 min    Activity Tolerance Patient tolerated treatment well    Behavior During Therapy WFL for tasks assessed/performed             Past Medical History:  Diagnosis Date   History of fracture of right ankle    Past Surgical History:  Procedure Laterality Date   ANKLE SURGERY     There are no problems to display for this patient.   PCP:  No Pcp Per Patient  REFERRING PROVIDER: Jackie Plum, MD   REFERRING DIAG: M54.17 (ICD-10-CM) - Lumbosacral radiculopathy  THERAPY DIAG:  Other low back pain  Radiculopathy, lumbosacral region  Muscle weakness (generalized)  RATIONALE FOR EVALUATION AND TREATMENT: Rehabilitation  ONSET DATE: ~3 months  NEXT MD VISIT: August 2024   SUBJECTIVE:                                                                                                                                                                                                         SUBJECTIVE STATEMENT: Pt reports sharp pain in his low back a few times a week - triggered by laying on his stomach, prolonged sitting or picking up heavy things. Pain originated ~ 3 months ago with repetitive heavy lifting while working for [employer].  Initially had numbness and tingling down entire R LE but now more localized to the R buttock.   PAIN: Are you having pain? No and Yes: NPRS scale: 0/10 currently, up to 8/10 Pain location: midline low back pain with R buttock radicular pain Pain description: sharp, throbbing in low back; numbness and tingling into R buttock Aggravating factors: laying on  stomach, prolonged sitting, lifting heavy objects Relieving factors: change position, standing up and walking around  PERTINENT HISTORY:  R ankle fracture s/p ORIF  PRECAUTIONS: None  WEIGHT BEARING RESTRICTIONS: No  FALLS:  Has patient fallen in last 6 months? No  LIVING ENVIRONMENT: Lives with: lives with their son Lives in: House/apartment Stairs: No Has following equipment at home: None  OCCUPATION: Unemployed  PLOF: Independent and Leisure: working out - Social research officer, government, Insurance underwriter, push-ups  PATIENT GOALS: "  To make the pain ease away."   OBJECTIVE: (objective measures completed at initial evaluation unless otherwise dated)  DIAGNOSTIC FINDINGS:  No recent imaging available.   PATIENT SURVEYS:  Modified Oswestry 14 / 50 = 28.0 %  FOTO Lumbar = 58, predicted = 65  SCREENING FOR RED FLAGS: Bowel or bladder incontinence: No Spinal tumors: No Cauda equina syndrome: No Compression fracture: No Abdominal aneurysm: No  COGNITION:  Overall cognitive status: Within functional limits for tasks assessed    SENSATION: WFL Intermittent numbness and tingling into R buttock, previously down R LE  MUSCLE LENGTH: Hamstrings: WNL ITB: WNL Piriformis: mild tight B Hip flexors: mild tight R Quads: mild tight R Heelcord: NT  POSTURE:  No Significant postural limitations  PALPATION: Mild increased muscle tension and TTP in lower R lumbar parapspinals and R medial upper glutes  LUMBAR ROM:   Active  A/PROM  eval  Flexion WFL - stiff/tight  Extension WFL - tight  Right lateral flexion WFL - tight  Left lateral flexion WFL  Right rotation WNL  Left rotation WNL  (Blank rows = not tested)  LOWER EXTREMITY ROM:    B LE AROM WFL/WNL  LOWER EXTREMITY MMT:    MMT Right eval Left eval  Hip flexion 4- 4  Hip extension 4- 4-  Hip abduction 4- 4  Hip adduction 3+ 4-  Hip internal rotation 4 4  Hip external rotation 4- 4  Knee flexion 4+ 5  Knee extension  4+ 5  Ankle dorsiflexion 4- 4+  Ankle plantarflexion    Ankle inversion    Ankle eversion     (Blank rows = not tested)  LUMBAR SPECIAL TESTS:  Straight leg raise test: Negative    TODAY'S TREATMENT:   02/20/23 THERAPEUTIC EXERCISE: to improve flexibility, strength and mobility.  Demonstration, verbal and tactile cues throughout for technique. POE x 30" - Pt instructed to try this in presence of radicular numbness and tingling in R buttock/LE & explained concept of centralization of pain Mod thomas R hip flexor/quad stretch x 30" Hooklying R KTOS piriformis stretch x 30"  Supine R figure-4 to chest piriformis stretch x 30"  Hooklying TrA isometric progressing to PPT 10 x 5" Hooklying PPT with adduction using small ball 10 x 5" Hooklying GTB alt single leg bent knee fallouts 10 x 3"   Bridge + GTB hip ABD isometric 10 x 5"    PATIENT EDUCATION:  Education details: PT eval findings, anticipated POC, and initial HEP Person educated: Patient Education method: Programmer, multimedia, Demonstration, Verbal cues, and Handouts Education comprehension: verbalized understanding, returned demonstration, verbal cues required, and needs further education  HOME EXERCISE PROGRAM: Access Code: [email or web address] URL: [email or web address] Date: 02/20/2023 Prepared by: Glenetta Hew  Exercises - Static Prone on Elbows  - 2-3 x daily - 7 x weekly - 2 sets - 3 reps - 30 sec hold - Supine Quadriceps Stretch with Strap on Table  - 2-3 x daily - 7 x weekly - 3 reps - 30 sec hold - Supine Piriformis Stretch with Foot on Ground  - 2-3 x daily - 7 x weekly - 3 reps - 30 sec hold - Supine Figure 4 Piriformis Stretch  - 2-3 x daily - 7 x weekly - 3 reps - 30 sec hold - Supine Posterior Pelvic Tilt  - 1 x daily - 7 x weekly - 2 sets - 10 reps - 5 sec hold - Posterior Pelvic Tilt with Adduction Using Pillow in Hooklying  - 1  x daily - 7 x weekly - 2 sets - 10 reps - 5 sec hold - Hooklying Single Leg Bent Knee  Fallouts with Resistance  - 1 x daily - 7 x weekly - 2 sets - 10 reps - 3 sec hold - Supine Bridge with Resistance Band  - 1 x daily - 7 x weekly - 2 sets - 10 reps - 5 sec hold   ASSESSMENT:  CLINICAL IMPRESSION: Christian Tate is a 52 y.o. male who was seen today for physical therapy evaluation and treatment for midline LBP with R lumbosacral radiculopathy. He reports onset of pain ~3 months ago while performing repetitive heavy lifting while working at [employer]. Pain & radiculopathy currently intermittent with R LE radicular numbness and tingling only extending into R buttock, where it had previously extended down the entire LE. Current deficits include intermittent midline LBP with R LE radiculopathy, lumbar and proximal LE stiffness with mild tightness, increased muscle tension with TTP over R lower lumbar paraspinals and upper medial glutes, and moderate proximal LE weakness R>L. Pain has caused him to leave his job and interferes with daily activities and household chores such a vacuuming. Christian Tate will benefit from skilled PT to address above deficits to improve mobility and activity tolerance with decreased pain interference.   OBJECTIVE IMPAIRMENTS: decreased activity tolerance, decreased knowledge of condition, decreased mobility, difficulty walking, decreased ROM, decreased strength, hypomobility, increased fascial restrictions, impaired perceived functional ability, increased muscle spasms, impaired flexibility, improper body mechanics, postural dysfunction, and pain.   ACTIVITY LIMITATIONS: carrying, lifting, bending, sitting, standing, squatting, sleeping, stairs, and reach over head  PARTICIPATION LIMITATIONS: meal prep, cleaning, laundry, community activity, and occupation  PERSONAL FACTORS: Fitness, Past/current experiences, Time since onset of injury/illness/exacerbation, and 1 comorbidity: R ankle fracture s/p ORIF  are also affecting patient's functional outcome.   REHAB  POTENTIAL: Excellent  CLINICAL DECISION MAKING: Stable/uncomplicated  EVALUATION COMPLEXITY: Low   GOALS: Goals reviewed with patient? Yes  SHORT TERM GOALS: Target date: 03/17/2023  Patient will be independent with initial HEP to improve outcomes and carryover.  Baseline: Initial HEP provided on eval Goal status: INITIAL  2.  Patient will report continued centralization of R LE radicular symptoms.  Baseline: Pt reporting radicular numbness and tingling initially spanning entire R LE but now more localized to R buttock. Goal status: INITIAL  LONG TERM GOALS: Target date: 04/07/2023  Patient will be independent with ongoing/advanced HEP for self-management at home.  Baseline:  Goal status: INITIAL  2.  Patient will report 75% improvement in low back pain & R LE radiculopathy to improve QOL.  Baseline: intermittent pain up to 8/10 Goal status: INITIAL  3.  Patient to demonstrate ability to achieve and maintain good spinal alignment/posturing and body mechanics needed for daily activities. Baseline:  Goal status: INITIAL  4.  Patient will demonstrate full pain free lumbar ROM to perform ADLs.   Baseline: Refer to above lumbar ROM table Goal status: INITIAL  5.  Patient will demonstrate improved B LE strength to >/= 4+/5 for improved stability and ease of mobility. Baseline: Refer to above LE MMT table Goal status: INITIAL  6.  Patient will report >/= 65 on lumbar FOTO to demonstrate improved functional ability.  Baseline: 58 Goal status: INITIAL   7. Patient will report </= 16% on Modified Oswestry to demonstrate improved functional ability with decreased pain interference. Baseline: 14 / 50 = 28.0 % Goal status: INITIAL  8.  Patient will tolerate >60 min of  sitting w/o increased pain to allow for improved mobility and activity tolerance. Baseline: Increased pain with prolonged sitting Goal status: INITIAL   PLAN:  PT FREQUENCY: 2x/week  PT DURATION: 6  weeks  PLANNED INTERVENTIONS: Therapeutic exercises, Therapeutic activity, Neuromuscular re-education, Balance training, Patient/Family education, Self Care, Joint mobilization, Aquatic Therapy, Dry Needling, Electrical stimulation, Spinal manipulation, Spinal mobilization, Cryotherapy, Moist heat, Taping, Traction, Manual therapy, and Re-evaluation  PLAN FOR NEXT SESSION: Review initial HEP; progress lumbopelvic flexibility and strengthening   Marry Guan, PT 02/20/2023, 6:55 PM

## 2023-02-20 ENCOUNTER — Ambulatory Visit: Payer: Medicaid Other | Admitting: Physical Therapy

## 2023-02-20 ENCOUNTER — Encounter: Payer: Self-pay | Admitting: Physical Therapy

## 2023-02-20 ENCOUNTER — Other Ambulatory Visit: Payer: Self-pay

## 2023-02-20 DIAGNOSIS — M6281 Muscle weakness (generalized): Secondary | ICD-10-CM

## 2023-02-20 DIAGNOSIS — M5417 Radiculopathy, lumbosacral region: Secondary | ICD-10-CM

## 2023-02-20 DIAGNOSIS — M5459 Other low back pain: Secondary | ICD-10-CM | POA: Diagnosis present

## 2023-02-23 ENCOUNTER — Ambulatory Visit: Payer: Medicaid Other

## 2023-02-27 ENCOUNTER — Encounter: Payer: Self-pay | Admitting: Physical Therapy

## 2023-02-27 ENCOUNTER — Ambulatory Visit: Payer: Medicaid Other | Admitting: Physical Therapy

## 2023-02-27 DIAGNOSIS — M5459 Other low back pain: Secondary | ICD-10-CM

## 2023-02-27 DIAGNOSIS — M6281 Muscle weakness (generalized): Secondary | ICD-10-CM

## 2023-02-27 DIAGNOSIS — M5417 Radiculopathy, lumbosacral region: Secondary | ICD-10-CM

## 2023-02-27 NOTE — Therapy (Addendum)
OUTPATIENT PHYSICAL THERAPY TREATMENT/Discharge   Patient Name: Christian Tate MRN: [id number] DOB:1971-01-08, 52 y.o., male Today's Date: 02/27/2023  END OF SESSION:  PT End of Session - 02/27/23 1619     Visit Number 2    Date for PT Re-Evaluation 04/07/23    Authorization Type [employer]    Authorization Time Period 02/21/23 - 04/22/23    Authorization - Visit Number 1    Authorization - Number of Visits 10    PT Start Time 1619    PT Stop Time 1706    PT Time Calculation (min) 47 min    Activity Tolerance Patient tolerated treatment well    Behavior During Therapy WFL for tasks assessed/performed              Past Medical History:  Diagnosis Date   History of fracture of right ankle    Past Surgical History:  Procedure Laterality Date   ANKLE SURGERY     There are no problems to display for this patient.   PCP:  No Pcp Per Patient  REFERRING PROVIDER: Jackie Plum, MD   REFERRING DIAG: M54.17 (ICD-10-CM) - Lumbosacral radiculopathy  THERAPY DIAG:  Other low back pain  Radiculopathy, lumbosacral region  Muscle weakness (generalized)  RATIONALE FOR EVALUATION AND TREATMENT: Rehabilitation  ONSET DATE: ~3 months  NEXT MD VISIT: August 2024   SUBJECTIVE:                                                                                                                                                                                                         SUBJECTIVE STATEMENT: Pt denies pain today but reports increased pain yesterday after being on his feet more.  PAIN: Are you having pain? No  PERTINENT HISTORY:  R ankle fracture s/p ORIF  PRECAUTIONS: None  WEIGHT BEARING RESTRICTIONS: No  FALLS:  Has patient fallen in last 6 months? No  LIVING ENVIRONMENT: Lives with: lives with their son Lives in: House/apartment Stairs: No Has following equipment at home: None  OCCUPATION: Unemployed  PLOF: Independent and Leisure: working  out - Social research officer, government, Insurance underwriter, push-ups  PATIENT GOALS: "To make the pain ease away."   OBJECTIVE: (objective measures completed at initial evaluation unless otherwise dated)  DIAGNOSTIC FINDINGS:  No recent imaging available.   PATIENT SURVEYS:  Modified Oswestry 14 / 50 = 28.0 %  FOTO Lumbar = 58, predicted = 65  SCREENING FOR RED FLAGS: Bowel or bladder incontinence: No Spinal tumors: No Cauda equina syndrome: No Compression fracture: No Abdominal aneurysm: No  COGNITION:  Overall cognitive status: Within functional limits for tasks assessed    SENSATION: WFL Intermittent numbness and tingling into R buttock, previously down R LE  MUSCLE LENGTH: Hamstrings: WNL ITB: WNL Piriformis: mild tight B Hip flexors: mild tight R Quads: mild tight R Heelcord: NT  POSTURE:  No Significant postural limitations  PALPATION: Mild increased muscle tension and TTP in lower R lumbar parapspinals and R medial upper glutes  LUMBAR ROM:   Active  A/PROM  eval  Flexion WFL - stiff/tight  Extension WFL - tight  Right lateral flexion WFL - tight  Left lateral flexion WFL  Right rotation WNL  Left rotation WNL  (Blank rows = not tested)  LOWER EXTREMITY ROM:    B LE AROM WFL/WNL  LOWER EXTREMITY MMT:    MMT Right eval Left eval  Hip flexion 4- 4  Hip extension 4- 4-  Hip abduction 4- 4  Hip adduction 3+ 4-  Hip internal rotation 4 4  Hip external rotation 4- 4  Knee flexion 4+ 5  Knee extension 4+ 5  Ankle dorsiflexion 4- 4+  Ankle plantarflexion    Ankle inversion    Ankle eversion     (Blank rows = not tested)  LUMBAR SPECIAL TESTS:  Straight leg raise test: Negative    TODAY'S TREATMENT:   02/27/23 THERAPEUTIC EXERCISE: to improve flexibility, strength and mobility.  Demonstration, verbal and tactile cues throughout for technique.  Rec Bike - L2 x 6 min POE 2 x 30"  Mod thomas R hip flexor/quad stretch x 30" Hooklying R KTOS piriformis stretch  x 30"  Supine R figure-4 to chest piriformis stretch x 30"  Hooklying PPT 10 x 5" Hooklying PPT with adduction using small ball 10 x 5" Hooklying GTB alt single leg bent knee fallouts 10 x 3"   Bridge + GTB hip ABD isometric 10 x 5"  Counter squat 10 x 5" - cues for upright torso with posterior weight sight - explained how to use squat with lifting to reduce strain on lumbar spine Standing TrA + GTB rows 10 x 5" Standing TrA + GTB scap retraction & shoulder extension to neutral 10 x 5"   02/20/23 THERAPEUTIC EXERCISE: to improve flexibility, strength and mobility.  Demonstration, verbal and tactile cues throughout for technique. POE x 30" - Pt instructed to try this in presence of radicular numbness and tingling in R buttock/LE & explained concept of centralization of pain Mod thomas R hip flexor/quad stretch x 30" Hooklying R KTOS piriformis stretch x 30"  Supine R figure-4 to chest piriformis stretch x 30"  Hooklying TrA isometric progressing to PPT 10 x 5" Hooklying PPT with adduction using small ball 10 x 5" Hooklying GTB alt single leg bent knee fallouts 10 x 3"   Bridge + GTB hip ABD isometric 10 x 5"    PATIENT EDUCATION:  Education details: PT eval findings, anticipated POC, and initial HEP Person educated: Patient Education method: Programmer, multimedia, Demonstration, Verbal cues, and Handouts Education comprehension: verbalized understanding, returned demonstration, verbal cues required, and needs further education  HOME EXERCISE PROGRAM: Access Code: [email or web address] URL: [email or web address] Date: 02/27/2023 Prepared by: Glenetta Hew  Exercises - Static Prone on Elbows  - 2-3 x daily - 7 x weekly - 2 sets - 3 reps - 30 sec hold - Supine Quadriceps Stretch with Strap on Table  - 2-3 x daily - 7 x weekly - 3 reps - 30 sec hold - Supine Piriformis Stretch with Foot on Ground  -  2-3 x daily - 7 x weekly - 3 reps - 30 sec hold - Supine Figure 4 Piriformis Stretch  - 2-3 x  daily - 7 x weekly - 3 reps - 30 sec hold - Supine Posterior Pelvic Tilt  - 1 x daily - 7 x weekly - 2 sets - 10 reps - 5 sec hold - Posterior Pelvic Tilt with Adduction Using Pillow in Hooklying  - 1 x daily - 7 x weekly - 2 sets - 10 reps - 5 sec hold - Hooklying Single Leg Bent Knee Fallouts with Resistance  - 1 x daily - 7 x weekly - 2 sets - 10 reps - 3 sec hold - Supine Bridge with Resistance Band  - 1 x daily - 7 x weekly - 2 sets - 10 reps - 5 sec hold - Squat with Counter Support  - 1 x daily - 7 x weekly - 2 sets - 10 reps - 3 sec hold - Standing Shoulder Row with Anchored Resistance  - 1 x daily - 7 x weekly - 2 sets - 10 reps - 5 sec hold - Scapular Retraction with Resistance Advanced  - 1 x daily - 7 x weekly - 2 sets - 10 reps - 5 sec hold   ASSESSMENT:  CLINICAL IMPRESSION: Whit reports selective performance of initial HEP but denies any issues with exercises attempted. HEP reviewed providing clarification of positioning, movement patterns, hold times and repetitions, with patient able to perform good return demonstration.  Progressed TE to include squats and postural strengthening to facilitate improved lifting mechanics as repetitive lifting seems to be trigger for his back pain, with explanation of carryover into actual lifting tasks demonstrated.  Will continue to progress strengthening with extension-based focus unless alternative preference determined as patient proceeds with exercises.  OBJECTIVE IMPAIRMENTS: decreased activity tolerance, decreased knowledge of condition, decreased mobility, difficulty walking, decreased ROM, decreased strength, hypomobility, increased fascial restrictions, impaired perceived functional ability, increased muscle spasms, impaired flexibility, improper body mechanics, postural dysfunction, and pain.   ACTIVITY LIMITATIONS: carrying, lifting, bending, sitting, standing, squatting, sleeping, stairs, and reach over head  PARTICIPATION LIMITATIONS:  meal prep, cleaning, laundry, community activity, and occupation  PERSONAL FACTORS: Fitness, Past/current experiences, Time since onset of injury/illness/exacerbation, and 1 comorbidity: R ankle fracture s/p ORIF  are also affecting patient's functional outcome.   REHAB POTENTIAL: Excellent  CLINICAL DECISION MAKING: Stable/uncomplicated  EVALUATION COMPLEXITY: Low   GOALS: Goals reviewed with patient? Yes  SHORT TERM GOALS: Target date: 03/17/2023  Patient will be independent with initial HEP to improve outcomes and carryover.  Baseline: Initial HEP provided on eval Goal status: IN PROGRESS  2.  Patient will report continued centralization of R LE radicular symptoms.  Baseline: Pt reporting radicular numbness and tingling initially spanning entire R LE but now more localized to R buttock. Goal status: IN PROGRESS  LONG TERM GOALS: Target date: 04/07/2023  Patient will be independent with ongoing/advanced HEP for self-management at home.  Baseline:  Goal status: IN PROGRESS  2.  Patient will report 75% improvement in low back pain & R LE radiculopathy to improve QOL.  Baseline: intermittent pain up to 8/10 Goal status: IN PROGRESS  3.  Patient to demonstrate ability to achieve and maintain good spinal alignment/posturing and body mechanics needed for daily activities. Baseline:  Goal status: IN PROGRESS  4.  Patient will demonstrate full pain free lumbar ROM to perform ADLs.   Baseline: Refer to above lumbar ROM table Goal status:  IN PROGRESS  5.  Patient will demonstrate improved B LE strength to >/= 4+/5 for improved stability and ease of mobility. Baseline: Refer to above LE MMT table Goal status: IN PROGRESS  6.  Patient will report >/= 65 on lumbar FOTO to demonstrate improved functional ability.  Baseline: 58 Goal status: IN PROGRESS   7. Patient will report </= 16% on Modified Oswestry to demonstrate improved functional ability with decreased pain  interference. Baseline: 14 / 50 = 28.0 % Goal status: IN PROGRESS  8.  Patient will tolerate >60 min of sitting w/o increased pain to allow for improved mobility and activity tolerance. Baseline: Increased pain with prolonged sitting Goal status: IN PROGRESS   PLAN:  PT FREQUENCY: 2x/week  PT DURATION: 6 weeks  PLANNED INTERVENTIONS: Therapeutic exercises, Therapeutic activity, Neuromuscular re-education, Balance training, Patient/Family education, Self Care, Joint mobilization, Aquatic Therapy, Dry Needling, Electrical stimulation, Spinal manipulation, Spinal mobilization, Cryotherapy, Moist heat, Taping, Traction, Manual therapy, and Re-evaluation  PLAN FOR NEXT SESSION: Posture and body mechanics education with emphasis on lifting techniques; progress lumbopelvic flexibility and strengthening with extension-based preference unless otherwise determined based on patient response to exercise - review & update HEP; MT +/- DN to address abnormal muscle tension as indicated   Marry Guan, PT 02/27/2023, 5:48 PM  PHYSICAL THERAPY DISCHARGE SUMMARY  Visits from Start of Care: 2  Current functional level related to goals / functional outcomes: See above   Remaining deficits: See above   Education / Equipment: HEP  Plan: Patient goals were not met. Patient is being discharged due no show policy after 3 no shows.      Jena Gauss, PT  04/19/2023 4:19 PM

## 2023-03-14 ENCOUNTER — Ambulatory Visit: Payer: Medicaid Other | Attending: Internal Medicine

## 2023-03-15 ENCOUNTER — Telehealth: Payer: Self-pay | Admitting: Physical Therapy

## 2023-03-21 ENCOUNTER — Ambulatory Visit: Payer: Medicaid Other | Admitting: Physical Therapy

## 2023-03-28 ENCOUNTER — Ambulatory Visit: Payer: Medicaid Other | Admitting: Physical Therapy

## 2023-04-06 ENCOUNTER — Encounter: Payer: Medicaid Other | Admitting: Physical Therapy

## 2023-04-11 ENCOUNTER — Encounter: Payer: Medicaid Other | Admitting: Physical Therapy

## 2023-04-17 ENCOUNTER — Encounter: Payer: Medicaid Other | Admitting: Physical Therapy

## 2023-09-13 ENCOUNTER — Ambulatory Visit: Payer: Medicaid Other | Admitting: Family Medicine

## 2023-09-13 NOTE — Progress Notes (Deleted)
 CHIEF COMPLAINT: No chief complaint on file.  _____________________________________________________________ SUBJECTIVE  HPI  Pt is a 53 y.o. male here for evaluation of  Athlete/Sport:***   No chief complaint on file.  - Previous similar episode(s):  - Onset:  - Location:  - Duration:  - Character:  - Associated sx: - Alleviating factors:  - Aggravating factors:  - Therapies tried:    ------------------------------------------------------------------------------------------------------ Past Medical History:  Diagnosis Date   History of fracture of right ankle     Past Surgical History:  Procedure Laterality Date   ANKLE SURGERY        Outpatient Encounter Medications as of 09/13/2023  Medication Sig Note   cyclobenzaprine (FLEXERIL) 5 MG tablet Take 5 mg by mouth 3 (three) times daily as needed for muscle spasms. 02/20/2023: Pt notes this does note seem to help, therefore does not take regularly   ergocalciferol (VITAMIN D2) 1.25 MG (50000 UT) capsule Take 50,000 Units by mouth once a week.    gabapentin (NEURONTIN) 100 MG capsule Take 100 mg by mouth 2 (two) times daily.    meloxicam (MOBIC) 7.5 MG tablet Take 7.5 mg by mouth daily. 1-2 tabs    Multiple Vitamin (MULTIVITAMIN WITH MINERALS) TABS tablet Take 1 tablet by mouth daily.    omeprazole  (PRILOSEC) 20 MG capsule Take 1 capsule (20 mg total) by mouth daily.    No facility-administered encounter medications on file as of 09/13/2023.    ------------------------------------------------------------------------------------------------------  _____________________________________________________________ OBJECTIVE  PHYSICAL EXAM  There were no vitals filed for this visit. There is no height or weight on file to calculate BMI.   reviewed  General: A+Ox3, no acute distress, well-nourished, appropriate affect CV: pulses 2+ regular, nondiaphoretic, no peripheral edema, cap refill <2sec Lungs: no audible wheezing,  non-labored breathing, bilateral chest rise/fall, nontachypneic Skin: warm, well-perfused, non-icteric, no susp lesions or rashes Neuro: CN II-XII intact bilaterally. Sensation intact, muscle tone wnl, no atrophy Psych: no signs of depression or anxiety MSK: ***      _____________________________________________________________ ASSESSMENT/PLAN There are no diagnoses linked to this encounter.  Summary: ***  Differential Diagnosis:  Treatment:   No follow-ups on file.  Electronically signed by: Benedict LELON Bumps, MD 09/13/2023 3:17 PM

## 2023-10-02 ENCOUNTER — Ambulatory Visit: Payer: Medicaid Other | Admitting: Family Medicine

## 2023-10-04 ENCOUNTER — Ambulatory Visit (HOSPITAL_BASED_OUTPATIENT_CLINIC_OR_DEPARTMENT_OTHER)
Admission: RE | Admit: 2023-10-04 | Discharge: 2023-10-04 | Disposition: A | Discharge status: Home / Self Care | Payer: Medicaid Other | Source: Ambulatory Visit | Attending: Family Medicine | Admitting: Family Medicine

## 2023-10-04 ENCOUNTER — Encounter: Payer: Self-pay | Admitting: Family Medicine

## 2023-10-04 ENCOUNTER — Ambulatory Visit (INDEPENDENT_AMBULATORY_CARE_PROVIDER_SITE_OTHER): Payer: Medicaid Other | Admitting: Family Medicine

## 2023-10-04 VITALS — BP 120/86 | Ht 69.0 in | Wt 185.0 lb

## 2023-10-04 DIAGNOSIS — M5416 Radiculopathy, lumbar region: Secondary | ICD-10-CM | POA: Insufficient documentation

## 2023-10-04 NOTE — Progress Notes (Signed)
CHIEF COMPLAINT: No chief complaint on file.  _____________________________________________________________ SUBJECTIVE  HPI  Pt is a 53 y.o. male here for evaluation of lower back pain  Referred by PCP, seen 08/21/2023, note reviewed: -Lumbosacral radiculopathy suspected -Persistent pain, controlled with regimen of 7.5 mg meloxicam, cyclobenzaprine 5 mg 3 times a day, gabapentin twice a day -Pain is mild-moderate, radiating down legs, experiences tingling/numbness, which is controlled by gabapentin -Exam for PCP visit demonstrated paraspinal tenderness, vertebral spine tenderness paraspinal spasm, moderate, negative SLR, symmetric reflexes and strength.  Referred to sports medicine with refill of aforementioned 3 medications.  No imaging available for review  Back pain ongoing for off/on 5-6 years, no inciting event (was at work and felt a tingling in his back; states he was given vitamin D supplementation)  Diagnosed with DDD Leg Numbness/tingling on/off intermittently for 3 months: Originates from lower back to R posterior knee, can also go down the L Primarily located: reports is midline   Tingling that goes mainly to the R>L Exacerbated by  Therapies tried so far:   Has started to reincorporate exercise for a few days, which has been going all right   ------------------------------------------------------------------------------------------------------ Past Medical History:  Diagnosis Date   History of fracture of right ankle     Past Surgical History:  Procedure Laterality Date   ANKLE SURGERY        Outpatient Encounter Medications as of 10/04/2023  Medication Sig Note   cyclobenzaprine (FLEXERIL) 5 MG tablet Take 5 mg by mouth 3 (three) times daily as needed for muscle spasms. 02/20/2023: Pt notes this does note seem to help, therefore does not take regularly   ergocalciferol (VITAMIN D2) 1.25 MG (50000 UT) capsule Take 50,000 Units by mouth once a week.    gabapentin  (NEURONTIN) 100 MG capsule Take 100 mg by mouth 2 (two) times daily.    meloxicam (MOBIC) 7.5 MG tablet Take 7.5 mg by mouth daily. 1-2 tabs    Multiple Vitamin (MULTIVITAMIN WITH MINERALS) TABS tablet Take 1 tablet by mouth daily.    omeprazole (PRILOSEC) 20 MG capsule Take 1 capsule (20 mg total) by mouth daily.    No facility-administered encounter medications on file as of 10/04/2023.    ------------------------------------------------------------------------------------------------------  _____________________________________________________________ OBJECTIVE  PHYSICAL EXAM  Today's Vitals   10/04/23 1536  BP: 120/86  Weight: 185 lb (83.9 kg)  Height: 5\' 9"  (1.753 m)   Body mass index is 27.32 kg/m.   reviewed  General: A+Ox3, no acute distress, well-nourished, appropriate affect CV: pulses 2+ regular, nondiaphoretic, no peripheral edema, cap refill <2sec Lungs: no audible wheezing, non-labored breathing, bilateral chest rise/fall, nontachypneic Skin: warm, well-perfused, non-icteric, no susp lesions or rashes Neuro:  Sensation intact, muscle tone wnl, no atrophy Psych: no signs of depression or anxiety MSK:  No midline lumbar tenderness noted.  There is paraspinal muscular hypertonicity.  Vague SI joint discomfort with palpation, symmetry also present.  Positive Trendelenburg.  For lumbar flexion 85, extension 10.  Stork test negative. Hip flexion 100, ER 40, IR 15.  Straight Leg Raise Test negative, FABER neg, FADIR neg.  Resisted hip adduction/abduction intact, does not elicit symptoms.  Piriformis testing negative.  Tight hamstrings on exam.  Positive Ober testing bilaterally.  Notably tight quadriceps.  Activation of hamstrings in prone position does not reproduce or elicit pain.  _____________________________________________________________ ASSESSMENT/PLAN Diagnoses and all orders for this visit:  Lumbar radiculopathy -     DG Lumbar Spine Complete; Future  Was not  able to elicit  radicular symptoms on exam today. Currently taking gabapentin, mobic, tylenol, has flexeril as Rx. Lumbar XR ordered for further evaluation. Discussed options for management. Offered formal PT, which was deferred in favor of continuing known HEP exercises, which was resumed just a few days ago. F/u pending films, if reassuring would anticipate f/u 4-6 weeks, sooner as needed for refractory, worsening, new symptoms, or desire to pursue additional workup. Consider MRI/ESI. All questions answered. Return precautions discussed. Patient verbalized understanding and is in agreement with plan  Electronically signed by: Burna Forts, MD 10/04/2023 8:01 AM

## 2023-10-27 ENCOUNTER — Other Ambulatory Visit: Payer: Self-pay | Admitting: Family Medicine

## 2023-10-27 DIAGNOSIS — M5416 Radiculopathy, lumbar region: Secondary | ICD-10-CM

## 2023-10-27 NOTE — Progress Notes (Signed)
Patient contacted to follow-up on symptoms, XR results reviewed over the phone. Still with persistently intermittent lumbar radiculopathy symptoms, primarily radiating numbness/tingling down RLE (but can be LLE) several times a week, unchanged from prior encounter, not improving with HEP exercises. Options discussed for management, and Manual expresses potential interest in [facility] for symptomatic management, is interested in MRI for further evaluation given chronicity and recalcitrant nature of his symptoms. This was ordered. Anticipate f/u after MRI, sooner as needed. All questions answered. Patient verbalized understanding and is in agreement with plan

## 2023-11-05 ENCOUNTER — Ambulatory Visit (HOSPITAL_BASED_OUTPATIENT_CLINIC_OR_DEPARTMENT_OTHER): Payer: Medicaid Other | Attending: Family Medicine

## 2023-11-19 ENCOUNTER — Ambulatory Visit (HOSPITAL_BASED_OUTPATIENT_CLINIC_OR_DEPARTMENT_OTHER)
Admission: RE | Admit: 2023-11-19 | Discharge: 2023-11-19 | Disposition: A | Discharge status: Home / Self Care | Source: Ambulatory Visit | Attending: Family Medicine | Admitting: Family Medicine

## 2023-11-19 DIAGNOSIS — M5416 Radiculopathy, lumbar region: Secondary | ICD-10-CM | POA: Insufficient documentation

## 2023-12-19 ENCOUNTER — Ambulatory Visit: Admitting: Sports Medicine
